# Patient Record
Sex: Female | Born: 1970 | Race: White | Hispanic: No | Marital: Single | Smoking: Former smoker
Health system: Southern US, Community
[De-identification: ages and names within clinical notes are randomized; demographics above are authoritative.]

## PROBLEM LIST (undated history)

## (undated) DIAGNOSIS — E119 Type 2 diabetes mellitus without complications: Secondary | ICD-10-CM

## (undated) DIAGNOSIS — J45909 Unspecified asthma, uncomplicated: Secondary | ICD-10-CM

## (undated) DIAGNOSIS — I10 Essential (primary) hypertension: Secondary | ICD-10-CM

## (undated) DIAGNOSIS — M797 Fibromyalgia: Secondary | ICD-10-CM

## (undated) DIAGNOSIS — K529 Noninfective gastroenteritis and colitis, unspecified: Secondary | ICD-10-CM

## (undated) DIAGNOSIS — G629 Polyneuropathy, unspecified: Secondary | ICD-10-CM

## (undated) DIAGNOSIS — G43909 Migraine, unspecified, not intractable, without status migrainosus: Secondary | ICD-10-CM

## (undated) DIAGNOSIS — G4730 Sleep apnea, unspecified: Secondary | ICD-10-CM

## (undated) DIAGNOSIS — K219 Gastro-esophageal reflux disease without esophagitis: Secondary | ICD-10-CM

## (undated) DIAGNOSIS — M545 Low back pain, unspecified: Secondary | ICD-10-CM

## (undated) DIAGNOSIS — F32A Depression, unspecified: Secondary | ICD-10-CM

## (undated) HISTORY — PX: TUBAL LIGATION: SHX77

## (undated) HISTORY — PX: ABDOMINAL HYSTERECTOMY: SHX81

## (undated) HISTORY — PX: HAND SURGERY: SHX662

## (undated) HISTORY — PX: BACK SURGERY: SHX140

## (undated) HISTORY — PX: ENDOSCOPIC PLANTAR FASCIOTOMY: SUR443

---

## 2011-08-11 HISTORY — PX: BACK SURGERY: SHX140

## 2021-04-15 ENCOUNTER — Encounter (HOSPITAL_COMMUNITY): Payer: Self-pay

## 2021-04-15 ENCOUNTER — Inpatient Hospital Stay (HOSPITAL_COMMUNITY)
Admission: EM | Admit: 2021-04-15 | Discharge: 2021-04-18 | DRG: 872 | Disposition: A | Discharge status: Home / Self Care | Payer: Medicare PPO | Attending: Internal Medicine | Admitting: Internal Medicine

## 2021-04-15 ENCOUNTER — Other Ambulatory Visit: Payer: Self-pay

## 2021-04-15 DIAGNOSIS — D75838 Other thrombocytosis: Secondary | ICD-10-CM | POA: Diagnosis present

## 2021-04-15 DIAGNOSIS — F32A Depression, unspecified: Secondary | ICD-10-CM | POA: Diagnosis present

## 2021-04-15 DIAGNOSIS — K219 Gastro-esophageal reflux disease without esophagitis: Secondary | ICD-10-CM | POA: Diagnosis present

## 2021-04-15 DIAGNOSIS — E785 Hyperlipidemia, unspecified: Secondary | ICD-10-CM | POA: Diagnosis present

## 2021-04-15 DIAGNOSIS — I10 Essential (primary) hypertension: Secondary | ICD-10-CM | POA: Diagnosis present

## 2021-04-15 DIAGNOSIS — A419 Sepsis, unspecified organism: Principal | ICD-10-CM | POA: Diagnosis present

## 2021-04-15 DIAGNOSIS — R197 Diarrhea, unspecified: Secondary | ICD-10-CM | POA: Diagnosis not present

## 2021-04-15 DIAGNOSIS — A09 Infectious gastroenteritis and colitis, unspecified: Secondary | ICD-10-CM | POA: Diagnosis present

## 2021-04-15 DIAGNOSIS — E1142 Type 2 diabetes mellitus with diabetic polyneuropathy: Secondary | ICD-10-CM | POA: Diagnosis present

## 2021-04-15 DIAGNOSIS — Z20822 Contact with and (suspected) exposure to covid-19: Secondary | ICD-10-CM | POA: Diagnosis present

## 2021-04-15 DIAGNOSIS — Z90710 Acquired absence of both cervix and uterus: Secondary | ICD-10-CM

## 2021-04-15 DIAGNOSIS — K529 Noninfective gastroenteritis and colitis, unspecified: Secondary | ICD-10-CM | POA: Diagnosis present

## 2021-04-15 DIAGNOSIS — J45909 Unspecified asthma, uncomplicated: Secondary | ICD-10-CM | POA: Diagnosis present

## 2021-04-15 DIAGNOSIS — E119 Type 2 diabetes mellitus without complications: Secondary | ICD-10-CM

## 2021-04-15 DIAGNOSIS — Z794 Long term (current) use of insulin: Secondary | ICD-10-CM

## 2021-04-15 DIAGNOSIS — K5900 Constipation, unspecified: Secondary | ICD-10-CM | POA: Diagnosis present

## 2021-04-15 DIAGNOSIS — Z87891 Personal history of nicotine dependence: Secondary | ICD-10-CM

## 2021-04-15 DIAGNOSIS — Z803 Family history of malignant neoplasm of breast: Secondary | ICD-10-CM

## 2021-04-15 HISTORY — DX: Noninfective gastroenteritis and colitis, unspecified: K52.9

## 2021-04-15 HISTORY — DX: Unspecified asthma, uncomplicated: J45.909

## 2021-04-15 HISTORY — DX: Gastro-esophageal reflux disease without esophagitis: K21.9

## 2021-04-15 HISTORY — DX: Type 2 diabetes mellitus without complications: E11.9

## 2021-04-15 HISTORY — DX: Migraine, unspecified, not intractable, without status migrainosus: G43.909

## 2021-04-15 HISTORY — DX: Depression, unspecified: F32.A

## 2021-04-15 HISTORY — DX: Polyneuropathy, unspecified: G62.9

## 2021-04-15 LAB — CBC WITH DIFFERENTIAL/PLATELET
Abs Immature Granulocytes: 0.13 10*3/uL — ABNORMAL HIGH (ref 0.00–0.07)
Basophils Absolute: 0.1 10*3/uL (ref 0.0–0.1)
Basophils Relative: 1 %
Eosinophils Absolute: 0.5 10*3/uL (ref 0.0–0.5)
Eosinophils Relative: 2 %
HCT: 42.3 % (ref 36.0–46.0)
Hemoglobin: 13.4 g/dL (ref 12.0–15.0)
Immature Granulocytes: 1 %
Lymphocytes Relative: 16 %
Lymphs Abs: 3.1 10*3/uL (ref 0.7–4.0)
MCH: 26.1 pg (ref 26.0–34.0)
MCHC: 31.7 g/dL (ref 30.0–36.0)
MCV: 82.5 fL (ref 80.0–100.0)
Monocytes Absolute: 1.6 10*3/uL — ABNORMAL HIGH (ref 0.1–1.0)
Monocytes Relative: 9 %
Neutro Abs: 13.7 10*3/uL — ABNORMAL HIGH (ref 1.7–7.7)
Neutrophils Relative %: 71 %
Platelets: 421 10*3/uL — ABNORMAL HIGH (ref 150–400)
RBC: 5.13 MIL/uL — ABNORMAL HIGH (ref 3.87–5.11)
RDW: 14.1 % (ref 11.5–15.5)
WBC: 19 10*3/uL — ABNORMAL HIGH (ref 4.0–10.5)
nRBC: 0 % (ref 0.0–0.2)

## 2021-04-15 LAB — URINALYSIS, ROUTINE W REFLEX MICROSCOPIC
Bilirubin Urine: NEGATIVE
Glucose, UA: 250 mg/dL — AB
Hgb urine dipstick: NEGATIVE
Ketones, ur: NEGATIVE mg/dL
Leukocytes,Ua: NEGATIVE
Nitrite: NEGATIVE
Protein, ur: NEGATIVE mg/dL
Specific Gravity, Urine: 1.01 (ref 1.005–1.030)
pH: 6.5 (ref 5.0–8.0)

## 2021-04-15 LAB — C DIFFICILE QUICK SCREEN W PCR REFLEX
C Diff antigen: NEGATIVE
C Diff interpretation: NOT DETECTED
C Diff toxin: NEGATIVE

## 2021-04-15 MED ORDER — ONDANSETRON HCL 4 MG/2ML IJ SOLN
4.0000 mg | Freq: Once | INTRAMUSCULAR | Status: AC
  Administered 2021-04-16: 4 mg via INTRAVENOUS
  Filled 2021-04-15: qty 2

## 2021-04-15 MED ORDER — MORPHINE SULFATE (PF) 4 MG/ML IV SOLN
4.0000 mg | Freq: Once | INTRAVENOUS | Status: AC
  Administered 2021-04-15: 4 mg via INTRAMUSCULAR
  Filled 2021-04-15: qty 1

## 2021-04-15 MED ORDER — HYDROMORPHONE HCL 2 MG/ML IJ SOLN: 2.0000 mg | Freq: Once | INTRAMUSCULAR | Status: DC

## 2021-04-15 MED ORDER — MORPHINE SULFATE (PF) 4 MG/ML IV SOLN
4.0000 mg | Freq: Once | INTRAVENOUS | Status: AC
Start: 2021-04-15 — End: 2021-04-16
  Administered 2021-04-16: 4 mg via INTRAVENOUS
  Filled 2021-04-15: qty 1

## 2021-04-15 MED ORDER — SODIUM CHLORIDE 0.9 % IV BOLUS
1000.0000 mL | Freq: Once | INTRAVENOUS | Status: AC
  Administered 2021-04-16: 1000 mL via INTRAVENOUS

## 2021-04-15 NOTE — ED Provider Notes (Signed)
[facility]-EMERGENCY DEPT Provider Note   CSN: [id number] Arrival date & time: 04/15/21  1826     History Chief Complaint  Patient presents with   Abdominal Pain   Blood In Stools   Emesis    [name] is a 50 y.o. female.  She is here with complaint of crampy abdominal pain that woke her up 3 this morning.  She went to the bathroom and initially was constipated but then had looser stools and eventually diarrhea.  She had the diarrhea was initially brown and dark but then became more pink in color.  Continues with 10 out of 10 cramping stabbing abdominal pain.  Feeling lightheaded.  Nausea no vomiting.  No fever.  No sick contacts.  Called her PCP who recommended she come to the emergency department for evaluation.  She does have a history of irritable bowel disease and has had hemorrhoidal bleeding in the past.  The history is provided by the patient.  Abdominal Pain Pain location:  Generalized Pain quality: cramping and stabbing   Pain radiates to:  Back Pain severity:  Severe Onset quality:  Sudden Duration:  16 hours Timing:  Constant Progression:  Unchanged Chronicity:  New Context: awakening from sleep   Context: not sick contacts and not trauma   Relieved by:  Nothing Worsened by:  Nothing Ineffective treatments:  Liquids and bowel activity Associated symptoms: constipation, diarrhea, fatigue, hematochezia, nausea and vomiting   Associated symptoms: no chest pain, no chills, no cough, no dysuria, no fever, no hematemesis, no hematuria, no shortness of breath, no sore throat, no vaginal bleeding and no vaginal discharge   Emesis Associated symptoms: abdominal pain and diarrhea   Associated symptoms: no chills, no cough, no fever, no headaches and no sore throat       Past Medical History:  Diagnosis Date   Asthma    Diabetes mellitus without complication (HCC)    GERD (gastroesophageal reflux disease)    IBD (inflammatory bowel  disease)    Migraine    Neuropathy     There are no problems to display for this patient.   Past Surgical History:  Procedure Laterality Date   ABDOMINAL HYSTERECTOMY     BACK SURGERY     ENDOSCOPIC PLANTAR FASCIOTOMY     HAND SURGERY     TUBAL LIGATION       OB History   No obstetric history on file.     History reviewed. No pertinent family history.  Social History   Tobacco Use   Smoking status: Never   Smokeless tobacco: Never  Vaping Use   Vaping Use: Never used  Substance Use Topics   Alcohol use: Yes   Drug use: Never    Home Medications Prior to Admission medications   Not on File    Allergies    Benadryl [diphenhydramine], Compazine [prochlorperazine], Lamictal [lamotrigine], Other, and Reglan [metoclopramide]  Review of Systems   Review of Systems  Constitutional:  Positive for fatigue. Negative for chills and fever.  HENT:  Negative for sore throat.   Eyes:  Negative for visual disturbance.  Respiratory:  Negative for cough and shortness of breath.   Cardiovascular:  Negative for chest pain.  Gastrointestinal:  Positive for abdominal pain, blood in stool, constipation, diarrhea, hematochezia, nausea and vomiting. Negative for hematemesis.  Genitourinary:  Negative for dysuria, hematuria, vaginal bleeding and vaginal discharge.  Musculoskeletal:  Positive for back pain.  Skin:  Negative for rash.  Neurological:  Negative  for headaches.   Physical Exam Updated Vital Signs BP (!) 158/100 (BP Location: Left Arm)   Pulse (!) 109   Temp 97.8 F (36.6 C) (Oral)   Resp 16   Ht 5\' 8"  (1.727 m)   Wt 96.6 kg   SpO2 99%   BMI 32.39 kg/m   Physical Exam Vitals and nursing note reviewed.  Constitutional:      General: She is not in acute distress.    Appearance: Normal appearance. She is well-developed.  HENT:     Head: Normocephalic and atraumatic.  Eyes:     Conjunctiva/sclera: Conjunctivae normal.  Cardiovascular:     Rate and Rhythm:  Normal rate and regular rhythm.     Heart sounds: No murmur heard. Pulmonary:     Effort: Pulmonary effort is normal. No respiratory distress.     Breath sounds: Normal breath sounds.  Abdominal:     Palpations: Abdomen is soft.     Tenderness: There is generalized abdominal tenderness. There is no guarding or rebound.  Musculoskeletal:        General: No deformity or signs of injury. Normal range of motion.     Cervical back: Neck supple.  Skin:    General: Skin is warm and dry.  Neurological:     General: No focal deficit present.     Mental Status: She is alert.    ED Results / Procedures / Treatments   Labs (all labs ordered are listed, but only abnormal results are displayed) Labs Reviewed  URINALYSIS, ROUTINE W REFLEX MICROSCOPIC - Abnormal; Notable for the following components:      Result Value   Glucose, UA 250 (*)    All other components within normal limits  COMPREHENSIVE METABOLIC PANEL - Abnormal; Notable for the following components:   Sodium 133 (*)    Potassium 5.2 (*)    CO2 18 (*)    Glucose, Bld 235 (*)    All other components within normal limits  CBC WITH DIFFERENTIAL/PLATELET - Abnormal; Notable for the following components:   WBC 19.0 (*)    RBC 5.13 (*)    Platelets 421 (*)    Neutro Abs 13.7 (*)    Monocytes Absolute 1.6 (*)    Abs Immature Granulocytes 0.13 (*)    All other components within normal limits  BASIC METABOLIC PANEL - Abnormal; Notable for the following components:   Chloride 112 (*)    CO2 19 (*)    Glucose, Bld 206 (*)    Calcium 8.6 (*)    All other components within normal limits  HEMOGLOBIN A1C - Abnormal; Notable for the following components:   Hgb A1c MFr Bld 8.6 (*)    All other components within normal limits  GLUCOSE, CAPILLARY - Abnormal; Notable for the following components:   Glucose-Capillary 232 (*)    All other components within normal limits  CBC - Abnormal; Notable for the following components:   WBC 14.2 (*)     Hemoglobin 11.8 (*)    All other components within normal limits  GLUCOSE, CAPILLARY - Abnormal; Notable for the following components:   Glucose-Capillary 163 (*)    All other components within normal limits  C DIFFICILE QUICK SCREEN W PCR REFLEX    RESP PANEL BY RT-PCR (FLU A&B, COVID) [id number]  GASTROINTESTINAL PANEL BY PCR, STOOL (REPLACES STOOL CULTURE)  LIPASE, BLOOD  LACTIC ACID, PLASMA  HIV ANTIBODY (ROUTINE TESTING W REFLEX)  TYPE AND SCREEN  ABO/RH    EKG  None  Radiology CT Abdomen Pelvis W Contrast  Result Date: 04/16/2021 CLINICAL DATA:  Generalized abdominal pain. Nausea and vomiting. Bloody diarrhea. Elevated white blood cell count. EXAM: CT ABDOMEN AND PELVIS WITH CONTRAST TECHNIQUE: Multidetector CT imaging of the abdomen and pelvis was performed using the standard protocol following bolus administration of intravenous contrast. CONTRAST:  59mL OMNIPAQUE IOHEXOL 350 MG/ML SOLN COMPARISON:  None. FINDINGS: Lower chest: No acute airspace disease or pleural effusion. Heart is normal in size. Hepatobiliary: Borderline hepatic steatosis. No focal hepatic lesion. Questionable layering sludge in the gallbladder. No calcified gallstone. No pericholecystic inflammation. No biliary dilatation. Pancreas: No ductal dilatation or inflammation. Spleen: Upper normal in size spanning 14.2 cm cranial caudal. No focal abnormality. Adrenals/Urinary Tract: Low-density left adrenal nodule measuring 11 mm transverse dimension most consistent with adenoma. Normal right adrenal gland. No hydronephrosis or perinephric edema. Homogeneous renal enhancement with symmetric excretion on delayed phase imaging. No visualized renal calculi. Subcentimeter low-density in the posterior lower left kidney is likely cyst but too small to characterize. Urinary bladder is physiologically distended without wall thickening. Stomach/Bowel: Colonic wall thickening with pericolonic edema extending from the splenic flexure of  the colon through the mid sigmoid. There is liquid stool in the left colon. No pneumatosis. Moderate colonic stool burden in the ascending and transverse colon. There is no obvious mass lesion at transition from stool filled to decompressed transverse colon. Normal appendix. Few fluid-filled loops of small bowel in the left abdomen likely reactive. No small bowel obstruction. Unremarkable stomach. Vascular/Lymphatic: Normal caliber abdominal aorta. Patent portal vein. No portal venous or mesenteric gas. Patent portal vein. Few small central mesenteric lymph nodes are not enlarged by size criteria and typically reactive. No enlarged lymph nodes in the abdomen or pelvis. Reproductive: Hysterectomy.  No adnexal mass. Other: No perforation or abscess. No ascites or free fluid. Trace thickening of the left pericolic gutter is reactive. Minimal fat in the left inguinal canal. Musculoskeletal: L5-S1 degenerative disc disease. There are no acute or suspicious osseous abnormalities. IMPRESSION: 1. Colitis extending from the splenic flexure of the colon through the mid sigmoid colon, likely infectious or inflammatory. No perforation or abscess. 2. Moderate stool in the right colon. While there is no obvious colonic mass at the transition from stool filled decompressed colon, up-to-date colonoscopy is recommended 3. Borderline hepatic steatosis. 4. Left adrenal adenoma. Electronically Signed   By: Narda Rutherford M.D.   On: 04/16/2021 01:37    Procedures Procedures   Medications Ordered in ED Medications  sodium chloride 0.9 % bolus 1,000 mL (has no administration in time range)  ondansetron (ZOFRAN) injection 4 mg (has no administration in time range)  morphine 4 MG/ML injection 4 mg (has no administration in time range)    ED Course  I have reviewed the triage vital signs and the nursing notes.  Pertinent labs & imaging results that were available during my care of the patient were reviewed by me and  considered in my medical decision making (see chart for details).  Clinical Course as of 04/16/21 1002  Tue Apr 15, 2021  2358 Ketones, ur: NEGATIVE [PC]  Wed Apr 16, 2021  0000 Patient's care signed out to Dr. Eudelia Bunch for further evaluation of labs and imaging.  Likely will need admission to the hospital. [MB]    Clinical Course User Index [MB] Terrilee Files, MD [PC] Cardama, Amadeo Garnet, MD   MDM Rules/Calculators/A&P  This patient complains of crampy abdominal pain, blood in her stool, diarrhea; this involves an extensive number of treatment Options and is a complaint that carries with it a high risk of complications and Morbidity. The differential includes colitis, diverticulitis, diverticulosis, C. difficile, infectious enteritis, dehydration, metabolic derangement  I ordered, reviewed and interpreted labs, which included CBC with elevated white count, normal hemoglobin, chemistries with low bicarb elevated glucose I ordered medication IV pain medication fluids nausea medication I ordered imaging studies which included CT abdomen pelvis, this is pending at time of signout Previous records obtained and reviewed in epic no recent admissions  After the interventions stated above, I reevaluated the patient and found patient to be symptomatically tender in her abdomen.  Signout to Dr. Eudelia Bunch ED physician with need for IV access and imaging.  Anticipate will need admission to the hospital for further management of probable colitis.   Final Clinical Impression(s) / ED Diagnoses Final diagnoses:  Colitis    Rx / DC Orders ED Discharge Orders     None        Terrilee Files, MD 04/16/21 1006

## 2021-04-15 NOTE — ED Provider Notes (Signed)
Emergency Medicine Provider Triage Evaluation Note  Veronica Cortez , a 50 y.o. female  was evaluated in triage.  Pt complains of abdominal pain.  Patient states that she was feeling normal last night and after eating steak, mushrooms, watermelon, and salad she woke up around 3 AM this morning with diffuse cramping abdominal pain, nausea without vomiting, as well as bloody diarrhea.  States that her stools initially appeared darker than normal, then turned watery brown, and then began turning red/pink.  Reports persistent diarrhea throughout the day today and states that she now has fatigue and lightheadedness when standing.  She reached out to her PCP who recommended that she come to the emergency department for evaluation.  Physical Exam  BP (!) 158/100 (BP Location: Left Arm)   Pulse (!) 109   Temp 97.8 F (36.6 C) (Oral)   Resp 16   Ht 5\' 8"  (1.727 m)   Wt 96.6 kg   SpO2 99%   BMI 32.39 kg/m  Gen:   Awake, no distress   Resp:  Normal effort  MSK:   Moves extremities without difficulty  Other:    Medical Decision Making  Medically screening exam initiated at 6:51 PM.  Appropriate orders placed.  Veronica Cortez was informed that the remainder of the evaluation will be completed by another provider, this initial triage assessment does not replace that evaluation, and the importance of remaining in the ED until their evaluation is complete.   Johnsie Kindred, PA-C 04/15/21 1852    06/15/21, MD 04/16/21 1006

## 2021-04-15 NOTE — ED Triage Notes (Signed)
Patient c/o generalized abdominal pain, bloody diarrhea, and vomiting since 0300. Patient states she called her doctor and told her to come to the ED to r/o E. Coli. Patient had steak and mushrooms, watermelon and salad.

## 2021-04-16 ENCOUNTER — Emergency Department (HOSPITAL_COMMUNITY): Payer: Medicare PPO

## 2021-04-16 ENCOUNTER — Encounter (HOSPITAL_COMMUNITY): Payer: Self-pay

## 2021-04-16 DIAGNOSIS — E119 Type 2 diabetes mellitus without complications: Secondary | ICD-10-CM

## 2021-04-16 DIAGNOSIS — F32A Depression, unspecified: Secondary | ICD-10-CM | POA: Diagnosis present

## 2021-04-16 DIAGNOSIS — R197 Diarrhea, unspecified: Secondary | ICD-10-CM | POA: Diagnosis present

## 2021-04-16 DIAGNOSIS — A419 Sepsis, unspecified organism: Secondary | ICD-10-CM | POA: Diagnosis present

## 2021-04-16 DIAGNOSIS — I10 Essential (primary) hypertension: Secondary | ICD-10-CM | POA: Diagnosis present

## 2021-04-16 DIAGNOSIS — Z90710 Acquired absence of both cervix and uterus: Secondary | ICD-10-CM | POA: Diagnosis not present

## 2021-04-16 DIAGNOSIS — K529 Noninfective gastroenteritis and colitis, unspecified: Secondary | ICD-10-CM | POA: Diagnosis not present

## 2021-04-16 DIAGNOSIS — E1142 Type 2 diabetes mellitus with diabetic polyneuropathy: Secondary | ICD-10-CM | POA: Diagnosis present

## 2021-04-16 DIAGNOSIS — Z794 Long term (current) use of insulin: Secondary | ICD-10-CM | POA: Diagnosis not present

## 2021-04-16 DIAGNOSIS — D75838 Other thrombocytosis: Secondary | ICD-10-CM | POA: Diagnosis present

## 2021-04-16 DIAGNOSIS — K5900 Constipation, unspecified: Secondary | ICD-10-CM | POA: Diagnosis present

## 2021-04-16 DIAGNOSIS — A09 Infectious gastroenteritis and colitis, unspecified: Secondary | ICD-10-CM | POA: Diagnosis present

## 2021-04-16 DIAGNOSIS — Z87891 Personal history of nicotine dependence: Secondary | ICD-10-CM | POA: Diagnosis not present

## 2021-04-16 DIAGNOSIS — J45909 Unspecified asthma, uncomplicated: Secondary | ICD-10-CM

## 2021-04-16 DIAGNOSIS — E785 Hyperlipidemia, unspecified: Secondary | ICD-10-CM | POA: Diagnosis present

## 2021-04-16 DIAGNOSIS — Z803 Family history of malignant neoplasm of breast: Secondary | ICD-10-CM | POA: Diagnosis not present

## 2021-04-16 DIAGNOSIS — Z20822 Contact with and (suspected) exposure to covid-19: Secondary | ICD-10-CM | POA: Diagnosis present

## 2021-04-16 DIAGNOSIS — K219 Gastro-esophageal reflux disease without esophagitis: Secondary | ICD-10-CM | POA: Diagnosis present

## 2021-04-16 LAB — HIV ANTIBODY (ROUTINE TESTING W REFLEX): HIV Screen 4th Generation wRfx: NONREACTIVE

## 2021-04-16 LAB — GASTROINTESTINAL PANEL BY PCR, STOOL (REPLACES STOOL CULTURE)

## 2021-04-16 LAB — CBC
HCT: 36.6 % (ref 36.0–46.0)
Hemoglobin: 11.8 g/dL — ABNORMAL LOW (ref 12.0–15.0)
MCH: 26.8 pg (ref 26.0–34.0)
MCHC: 32.2 g/dL (ref 30.0–36.0)
MCV: 83.2 fL (ref 80.0–100.0)
Platelets: 310 K/uL (ref 150–400)
RBC: 4.4 MIL/uL (ref 3.87–5.11)
RDW: 14 % (ref 11.5–15.5)
WBC: 14.2 K/uL — ABNORMAL HIGH (ref 4.0–10.5)
nRBC: 0 % (ref 0.0–0.2)

## 2021-04-16 LAB — BASIC METABOLIC PANEL WITH GFR
Anion gap: 7 (ref 5–15)
BUN: 12 mg/dL (ref 6–20)
CO2: 19 mmol/L — ABNORMAL LOW (ref 22–32)
Calcium: 8.6 mg/dL — ABNORMAL LOW (ref 8.9–10.3)
Chloride: 112 mmol/L — ABNORMAL HIGH (ref 98–111)
Creatinine, Ser: 0.68 mg/dL (ref 0.44–1.00)
GFR, Estimated: >60 mL/min
Glucose, Bld: 206 mg/dL — ABNORMAL HIGH (ref 70–99)
Potassium: 3.8 mmol/L (ref 3.5–5.1)
Sodium: 138 mmol/L (ref 135–145)

## 2021-04-16 LAB — TYPE AND SCREEN
ABO/RH(D): O POS
Antibody Screen: NEGATIVE

## 2021-04-16 LAB — ABO/RH: ABO/RH(D): O POS

## 2021-04-16 LAB — COMPREHENSIVE METABOLIC PANEL
ALT: 8 U/L (ref 0–44)
AST: 40 U/L (ref 15–41)
Albumin: 3.9 g/dL (ref 3.5–5.0)
Alkaline Phosphatase: 90 U/L (ref 38–126)
Anion gap: 11 (ref 5–15)
BUN: 14 mg/dL (ref 6–20)
CO2: 18 mmol/L — ABNORMAL LOW (ref 22–32)
Calcium: 8.9 mg/dL (ref 8.9–10.3)
Chloride: 104 mmol/L (ref 98–111)
Creatinine, Ser: 0.81 mg/dL (ref 0.44–1.00)
GFR, Estimated: >60 mL/min (ref >60)
Glucose, Bld: 235 mg/dL — ABNORMAL HIGH (ref 70–99)
Potassium: 5.2 mmol/L — ABNORMAL HIGH (ref 3.5–5.1)
Sodium: 133 mmol/L — ABNORMAL LOW (ref 135–145)
Total Bilirubin: 1.2 mg/dL (ref 0.3–1.2)
Total Protein: 7.3 g/dL (ref 6.5–8.1)

## 2021-04-16 LAB — RESP PANEL BY RT-PCR (FLU A&B, COVID) ARPGX2
Influenza A by PCR: NEGATIVE
Influenza B by PCR: NEGATIVE
SARS Coronavirus 2 by RT PCR: NEGATIVE

## 2021-04-16 LAB — GLUCOSE, CAPILLARY
Glucose-Capillary: 232 mg/dL — ABNORMAL HIGH (ref 70–99)
Glucose-Capillary: 163 mg/dL — ABNORMAL HIGH (ref 70–99)
Glucose-Capillary: 261 mg/dL — ABNORMAL HIGH (ref 70–99)
Glucose-Capillary: 267 mg/dL — ABNORMAL HIGH (ref 70–99)
Glucose-Capillary: 195 mg/dL — ABNORMAL HIGH (ref 70–99)
Glucose-Capillary: 193 mg/dL — ABNORMAL HIGH (ref 70–99)

## 2021-04-16 LAB — HEMOGLOBIN A1C
Hgb A1c MFr Bld: 8.6 % — ABNORMAL HIGH (ref 4.8–5.6)
Mean Plasma Glucose: 200.12 mg/dL

## 2021-04-16 LAB — LACTIC ACID, PLASMA: Lactic Acid, Venous: 1.5 mmol/L (ref 0.5–1.9)

## 2021-04-16 LAB — LIPASE, BLOOD: Lipase: 35 U/L (ref 11–51)

## 2021-04-16 MED ORDER — SODIUM CHLORIDE 0.9 % IV SOLN: INTRAVENOUS | Status: AC

## 2021-04-16 MED ORDER — UBROGEPANT 100 MG PO TABS: 100.0000 mg | ORAL_TABLET | Freq: Two times a day (BID) | ORAL | Status: DC | PRN

## 2021-04-16 MED ORDER — SODIUM CHLORIDE 0.9 % IV BOLUS
1000.0000 mL | Freq: Once | INTRAVENOUS | Status: AC
  Administered 2021-04-16: 1000 mL via INTRAVENOUS

## 2021-04-16 MED ORDER — TOPIRAMATE 100 MG PO TABS
200.0000 mg | ORAL_TABLET | Freq: Once | ORAL | Status: AC
  Administered 2021-04-17: 200 mg via ORAL
  Filled 2021-04-16: qty 2

## 2021-04-16 MED ORDER — ONDANSETRON HCL 4 MG PO TABS: 4.0000 mg | ORAL_TABLET | Freq: Four times a day (QID) | ORAL | Status: DC | PRN

## 2021-04-16 MED ORDER — METRONIDAZOLE 500 MG/100ML IV SOLN
500.0000 mg | Freq: Two times a day (BID) | INTRAVENOUS | Status: DC
  Administered 2021-04-16 – 2021-04-18 (×5): 500 mg via INTRAVENOUS
  Filled 2021-04-16 (×5): qty 100

## 2021-04-16 MED ORDER — ALBUTEROL SULFATE (2.5 MG/3ML) 0.083% IN NEBU: 3.0000 mL | INHALATION_SOLUTION | RESPIRATORY_TRACT | Status: DC | PRN

## 2021-04-16 MED ORDER — PANTOPRAZOLE SODIUM 40 MG PO TBEC
40.0000 mg | DELAYED_RELEASE_TABLET | Freq: Every day | ORAL | Status: DC
  Administered 2021-04-16 – 2021-04-18 (×3): 40 mg via ORAL
  Filled 2021-04-16 (×3): qty 1

## 2021-04-16 MED ORDER — HYDROMORPHONE HCL 1 MG/ML IJ SOLN
0.5000 mg | Freq: Once | INTRAMUSCULAR | Status: AC
  Administered 2021-04-16: 0.5 mg via INTRAVENOUS
  Filled 2021-04-16: qty 1

## 2021-04-16 MED ORDER — INSULIN ASPART 100 UNIT/ML IJ SOLN
0.0000 [IU] | INTRAMUSCULAR | Status: DC
  Administered 2021-04-16: 2 [IU] via SUBCUTANEOUS
  Administered 2021-04-16: 5 [IU] via SUBCUTANEOUS
  Administered 2021-04-16: 2 [IU] via SUBCUTANEOUS
  Administered 2021-04-16: 3 [IU] via SUBCUTANEOUS
  Administered 2021-04-16: 5 [IU] via SUBCUTANEOUS
  Administered 2021-04-17: 3 [IU] via SUBCUTANEOUS
  Administered 2021-04-17 (×2): 2 [IU] via SUBCUTANEOUS
  Administered 2021-04-17: 1 [IU] via SUBCUTANEOUS
  Administered 2021-04-17: 2 [IU] via SUBCUTANEOUS
  Administered 2021-04-17: 3 [IU] via SUBCUTANEOUS
  Administered 2021-04-18 (×2): 1 [IU] via SUBCUTANEOUS
  Administered 2021-04-18: 3 [IU] via SUBCUTANEOUS
  Filled 2021-04-16: qty 0.09

## 2021-04-16 MED ORDER — HYDROCODONE-ACETAMINOPHEN 5-325 MG PO TABS
1.0000 | ORAL_TABLET | ORAL | Status: DC | PRN
  Administered 2021-04-17 – 2021-04-18 (×4): 2 via ORAL
  Filled 2021-04-16 (×4): qty 2

## 2021-04-16 MED ORDER — PIPERACILLIN-TAZOBACTAM 3.375 G IVPB 30 MIN
3.3750 g | Freq: Once | INTRAVENOUS | Status: AC
  Administered 2021-04-16: 3.375 g via INTRAVENOUS
  Filled 2021-04-16: qty 50

## 2021-04-16 MED ORDER — LABETALOL HCL 5 MG/ML IV SOLN: 10.0000 mg | INTRAVENOUS | Status: DC | PRN

## 2021-04-16 MED ORDER — MOMETASONE FURO-FORMOTEROL FUM 100-5 MCG/ACT IN AERO
2.0000 | INHALATION_SPRAY | Freq: Two times a day (BID) | RESPIRATORY_TRACT | Status: DC
Start: 2021-04-16 — End: 2021-04-18
  Administered 2021-04-16 – 2021-04-18 (×5): 2 via RESPIRATORY_TRACT
  Filled 2021-04-16: qty 8.8

## 2021-04-16 MED ORDER — CIPROFLOXACIN IN D5W 400 MG/200ML IV SOLN
400.0000 mg | Freq: Two times a day (BID) | INTRAVENOUS | Status: DC
  Administered 2021-04-16 – 2021-04-18 (×5): 400 mg via INTRAVENOUS
  Filled 2021-04-16 (×5): qty 200

## 2021-04-16 MED ORDER — HYDROMORPHONE HCL 1 MG/ML IJ SOLN
0.5000 mg | INTRAMUSCULAR | Status: DC | PRN
  Administered 2021-04-16: 0.5 mg via INTRAVENOUS
  Administered 2021-04-16 (×2): 1 mg via INTRAVENOUS
  Administered 2021-04-16: 0.5 mg via INTRAVENOUS
  Administered 2021-04-17 (×3): 1 mg via INTRAVENOUS
  Filled 2021-04-16 (×7): qty 1

## 2021-04-16 MED ORDER — INSULIN GLARGINE-YFGN 100 UNIT/ML ~~LOC~~ SOLN
15.0000 [IU] | Freq: Every day | SUBCUTANEOUS | Status: DC
  Administered 2021-04-16 – 2021-04-17 (×2): 15 [IU] via SUBCUTANEOUS
  Filled 2021-04-16 (×2): qty 0.15

## 2021-04-16 MED ORDER — ONDANSETRON HCL 4 MG/2ML IJ SOLN
4.0000 mg | Freq: Four times a day (QID) | INTRAMUSCULAR | Status: DC | PRN
  Administered 2021-04-16 – 2021-04-17 (×2): 4 mg via INTRAVENOUS
  Filled 2021-04-16 (×3): qty 2

## 2021-04-16 MED ORDER — FLUOXETINE HCL 20 MG PO CAPS
20.0000 mg | ORAL_CAPSULE | Freq: Every day | ORAL | Status: DC
  Administered 2021-04-16 – 2021-04-18 (×3): 20 mg via ORAL
  Filled 2021-04-16 (×3): qty 1

## 2021-04-16 MED ORDER — IOHEXOL 350 MG/ML SOLN
80.0000 mL | Freq: Once | INTRAVENOUS | Status: AC | PRN
  Administered 2021-04-16: 80 mL via INTRAVENOUS

## 2021-04-16 MED ORDER — INSULIN ASPART 100 UNIT/ML IJ SOLN
0.0000 [IU] | INTRAMUSCULAR | Status: DC
  Filled 2021-04-16: qty 0.06

## 2021-04-16 MED ORDER — ACETAMINOPHEN 325 MG PO TABS: 650.0000 mg | ORAL_TABLET | Freq: Four times a day (QID) | ORAL | Status: DC | PRN

## 2021-04-16 MED ORDER — ACETAMINOPHEN 650 MG RE SUPP: 650.0000 mg | Freq: Four times a day (QID) | RECTAL | Status: DC | PRN

## 2021-04-16 NOTE — Plan of Care (Signed)
Patient alert and oriented x4. Oriented to unit. No c/o pain at this time. Will continue to monitor

## 2021-04-16 NOTE — Progress Notes (Signed)
Pass due labs due in ED currently being drawn with the morning labs due at his time.

## 2021-04-16 NOTE — H&P (Signed)
History and Physical    Steele Ledonne IRW:[id number] DOB: 1971/03/11 DOA: 04/15/2021  PCP: Ellyn Hack, MD   Patient coming from: Home   Chief Complaint: Abdominal pain, bloody diarrhea   HPI: Veronica Cortez is a 50 y.o. female with medical history significant for insulin-dependent diabetes mellitus, hypertension, GERD, IBS, hyperlipidemia, migraines, and depression, presenting to emergency department for evaluation of cramping abdominal pain, nausea, and bloody diarrhea.  Patient reports that she was in her usual state of health until she woke at approximately 3 AM on 04/15/2021 with cramping abdominal pain.  She has also had nausea without vomiting and diarrhea.  She continued to have more episodes of diarrhea than she can count with transition from watery stool to bloody diarrhea.  She denies any sick contacts or recent travel, had eaten some watermelon, steak, and mushrooms the night before, but is not aware of anyone else becoming ill after this meal.  She was having ongoing pain which is generalized and cramping in character, persistent bloody diarrhea, and was becoming lightheaded, prompting her presentation to the ED.  ED Course: Upon arrival to the ED, patient is found to be afebrile, saturating well on room air, and tachycardic to 120 with stable blood pressure.  Orthostatic vitals were positive.  Chemistry panel with glucose 235, bicarbonate 18, and potassium 5.2.  CBC with leukocytosis to 19,000 and mild thrombocytosis.  CT with colitis involving splenic flexure through mid sigmoid colon without perforation or abscess.  Patient was given IV fluids, morphine, Zofran, and Zosyn in the ED. C diff was negative.   Review of Systems:  All other systems reviewed and apart from HPI, are negative.  Past Medical History:  Diagnosis Date   Asthma    Depression    Diabetes mellitus without complication (HCC)    GERD (gastroesophageal reflux disease)    IBD (inflammatory bowel  disease)    Migraine    Neuropathy     Past Surgical History:  Procedure Laterality Date   ABDOMINAL HYSTERECTOMY     BACK SURGERY     ENDOSCOPIC PLANTAR FASCIOTOMY     HAND SURGERY     TUBAL LIGATION      Social History:   reports that she quit smoking about 10 years ago. Her smoking use included cigarettes. She has never used smokeless tobacco. She reports current alcohol use of about 2.0 standard drinks per week. She reports that she does not use drugs.  Allergies  Allergen Reactions   Benadryl [Diphenhydramine]    Compazine [Prochlorperazine]    Lamictal [Lamotrigine]    Other     Triptans and statins   Reglan [Metoclopramide]     Family History  Problem Relation Age of Onset   Breast cancer Mother    Kidney failure Father      Prior to Admission medications   Not on File    Physical Exam: Vitals:   04/15/21 2231 04/16/21 0015 04/16/21 0030 04/16/21 0100  BP: 137/85 129/88 137/69 (!) 158/90  Pulse: (!) 116 92 (!) 106 (!) 114  Resp: 18 18 18    Temp:      TempSrc:      SpO2: 96% 96% 93% 94%  Weight:      Height:        Constitutional: NAD, calm  Eyes: PERTLA, lids and conjunctivae normal ENMT: Mucous membranes are moist. Posterior pharynx clear of any exudate or lesions.   Neck: supple, no masses  Respiratory: no wheezing, no crackles. No  accessory muscle use.  Cardiovascular: S1 & S2 heard, regular rate and rhythm. No extremity edema.   Abdomen: No distension, no tenderness, soft. Bowel sounds active.  Musculoskeletal: no clubbing / cyanosis. No joint deformity upper and lower extremities.   Skin: no significant rashes, lesions, ulcers. Warm, dry, well-perfused. Neurologic: CN 2-12 grossly intact. Sensation intact. Moving all extremities.  Psychiatric: Alert and oriented to person, place, and situation. Pleasant and cooperative.    Labs and Imaging on Admission: I have personally reviewed following labs and imaging studies  CBC: Recent Labs  Lab  04/15/21 2330  WBC 19.0*  NEUTROABS 13.7*  HGB 13.4  HCT 42.3  MCV 82.5  PLT 421*   Basic Metabolic Panel: Recent Labs  Lab 04/15/21 2300  NA 133*  K 5.2*  CL 104  CO2 18*  GLUCOSE 235*  BUN 14  CREATININE 0.81  CALCIUM 8.9   GFR: Estimated Creatinine Clearance: 101 mL/min (by C-G formula based on SCr of 0.81 mg/dL). Liver Function Tests: Recent Labs  Lab 04/15/21 2300  AST 40  ALT 8  ALKPHOS 90  BILITOT 1.2  PROT 7.3  ALBUMIN 3.9   Recent Labs  Lab 04/15/21 2300  LIPASE 35   No results for input(s): AMMONIA in the last 168 hours. Coagulation Profile: No results for input(s): INR, PROTIME in the last 168 hours. Cardiac Enzymes: No results for input(s): CKTOTAL, CKMB, CKMBINDEX, TROPONINI in the last 168 hours. BNP (last 3 results) No results for input(s): PROBNP in the last 8760 hours. HbA1C: No results for input(s): HGBA1C in the last 72 hours. CBG: No results for input(s): GLUCAP in the last 168 hours. Lipid Profile: No results for input(s): CHOL, HDL, LDLCALC, TRIG, CHOLHDL, LDLDIRECT in the last 72 hours. Thyroid Function Tests: No results for input(s): TSH, T4TOTAL, FREET4, T3FREE, THYROIDAB in the last 72 hours. Anemia Panel: No results for input(s): VITAMINB12, FOLATE, FERRITIN, TIBC, IRON, RETICCTPCT in the last 72 hours. Urine analysis:    Component Value Date/Time   COLORURINE YELLOW 04/15/2021 2140   APPEARANCEUR CLEAR 04/15/2021 2140   LABSPEC 1.010 04/15/2021 2140   PHURINE 6.5 04/15/2021 2140   GLUCOSEU 250 (A) 04/15/2021 2140   HGBUR NEGATIVE 04/15/2021 2140   BILIRUBINUR NEGATIVE 04/15/2021 2140   KETONESUR NEGATIVE 04/15/2021 2140   PROTEINUR NEGATIVE 04/15/2021 2140   NITRITE NEGATIVE 04/15/2021 2140   LEUKOCYTESUR NEGATIVE 04/15/2021 2140   Sepsis Labs: @LABRCNTIP (procalcitonin:4,lacticidven:4) ) Recent Results (from the past 240 hour(s))  C Difficile Quick Screen w PCR reflex     Status: None   Collection Time: 04/15/21   7:17 PM   Specimen: STOOL  Result Value Ref Range Status   C Diff antigen NEGATIVE NEGATIVE Final   C Diff toxin NEGATIVE NEGATIVE Final   C Diff interpretation No C. difficile detected.  Final    Comment: Performed at [facility], [address], [place], [place]  Resp Panel by RT-PCR (Flu A&B, Covid) Nasopharyngeal Swab     Status: None   Collection Time: 04/16/21 12:19 AM   Specimen: Nasopharyngeal Swab; Nasopharyngeal(NP) swabs in vial transport medium  Result Value Ref Range Status   SARS Coronavirus 2 by RT PCR NEGATIVE NEGATIVE Final    Comment: (NOTE) SARS-CoV-2 target nucleic acids are NOT DETECTED.  The SARS-CoV-2 RNA is generally detectable in upper respiratory specimens during the acute phase of infection. The lowest concentration of SARS-CoV-2 viral copies this assay can detect is 138 copies/mL. A negative result does not preclude  SARS-Cov-2 infection and should not be used as the sole basis for treatment or other patient management decisions. A negative result may occur with  improper specimen collection/handling, submission of specimen other than nasopharyngeal swab, presence of viral mutation(s) within the areas targeted by this assay, and inadequate number of viral copies(<138 copies/mL). A negative result must be combined with clinical observations, patient history, and epidemiological information. The expected result is Negative.  Fact Sheet for Patients:  BloggerCourse.com  Fact Sheet for Healthcare Providers:  SeriousBroker.it  This test is no t yet approved or cleared by the [place] FDA and  has been authorized for detection and/or diagnosis of SARS-CoV-2 by FDA under an Emergency Use Authorization (EUA). This EUA will remain  in effect (meaning this test can be used) for the duration of the COVID-19 declaration under Section 564(b)(1) of the Act, 21 U.S.C.section  360bbb-3(b)(1), unless the authorization is terminated  or revoked sooner.       Influenza A by PCR NEGATIVE NEGATIVE Final   Influenza B by PCR NEGATIVE NEGATIVE Final    Comment: (NOTE) The Xpert Xpress SARS-CoV-2/FLU/RSV plus assay is intended as an aid in the diagnosis of influenza from Nasopharyngeal swab specimens and should not be used as a sole basis for treatment. Nasal washings and aspirates are unacceptable for Xpert Xpress SARS-CoV-2/FLU/RSV testing.  Fact Sheet for Patients: BloggerCourse.com  Fact Sheet for Healthcare Providers: SeriousBroker.it  This test is not yet approved or cleared by the [place] FDA and has been authorized for detection and/or diagnosis of SARS-CoV-2 by FDA under an Emergency Use Authorization (EUA). This EUA will remain in effect (meaning this test can be used) for the duration of the COVID-19 declaration under Section 564(b)(1) of the Act, 21 U.S.C. section 360bbb-3(b)(1), unless the authorization is terminated or revoked.  Performed at [facility], [address] [place], [place] [address]      Radiological Exams on Admission: CT Abdomen Pelvis W Contrast  Result Date: 04/16/2021 CLINICAL DATA:  Generalized abdominal pain. Nausea and vomiting. Bloody diarrhea. Elevated white blood cell count. EXAM: CT ABDOMEN AND PELVIS WITH CONTRAST TECHNIQUE: Multidetector CT imaging of the abdomen and pelvis was performed using the standard protocol following bolus administration of intravenous contrast. CONTRAST:  28mL OMNIPAQUE IOHEXOL 350 MG/ML SOLN COMPARISON:  None. FINDINGS: Lower chest: No acute airspace disease or pleural effusion. Heart is normal in size. Hepatobiliary: Borderline hepatic steatosis. No focal hepatic lesion. Questionable layering sludge in the gallbladder. No calcified gallstone. No pericholecystic inflammation. No biliary dilatation. Pancreas: No ductal  dilatation or inflammation. Spleen: Upper normal in size spanning 14.2 cm cranial caudal. No focal abnormality. Adrenals/Urinary Tract: Low-density left adrenal nodule measuring 11 mm transverse dimension most consistent with adenoma. Normal right adrenal gland. No hydronephrosis or perinephric edema. Homogeneous renal enhancement with symmetric excretion on delayed phase imaging. No visualized renal calculi. Subcentimeter low-density in the posterior lower left kidney is likely cyst but too small to characterize. Urinary bladder is physiologically distended without wall thickening. Stomach/Bowel: Colonic wall thickening with pericolonic edema extending from the splenic flexure of the colon through the mid sigmoid. There is liquid stool in the left colon. No pneumatosis. Moderate colonic stool burden in the ascending and transverse colon. There is no obvious mass lesion at transition from stool filled to decompressed transverse colon. Normal appendix. Few fluid-filled loops of small bowel in the left abdomen likely reactive. No small bowel obstruction. Unremarkable stomach. Vascular/Lymphatic: Normal caliber abdominal aorta. Patent portal vein. No  portal venous or mesenteric gas. Patent portal vein. Few small central mesenteric lymph nodes are not enlarged by size criteria and typically reactive. No enlarged lymph nodes in the abdomen or pelvis. Reproductive: Hysterectomy.  No adnexal mass. Other: No perforation or abscess. No ascites or free fluid. Trace thickening of the left pericolic gutter is reactive. Minimal fat in the left inguinal canal. Musculoskeletal: L5-S1 degenerative disc disease. There are no acute or suspicious osseous abnormalities. IMPRESSION: 1. Colitis extending from the splenic flexure of the colon through the mid sigmoid colon, likely infectious or inflammatory. No perforation or abscess. 2. Moderate stool in the right colon. While there is no obvious colonic mass at the transition from stool  filled decompressed colon, up-to-date colonoscopy is recommended 3. Borderline hepatic steatosis. 4. Left adrenal adenoma. Electronically Signed   By: Narda RutherfordMelanie  Sanford M.D.   On: 04/16/2021 01:37     Assessment/Plan   1. Acute colitis  - Presents with one day of cramping abdominal pain and bloody diarrhea and is found to be tachycardic with WBC 19,000 and colitis on CT  - Continue IVF hydration, empiric antibiotics, enteric precautions, follow-up pending GI pathogen panel, monitor electrolytes, trend H&H, and advance diet as she improves    2. Insulin-dependent DM  - Continue CBG checks and insulin   3. Asthma   - No cough, wheezing, or SOB on admission  - Continue Singulair, ICS/LABA, and as-needed SABA    4. Hypertension  - Pt unsure of atenolol dose, has orthostasis on admission, will treat as-needed only for now     DVT prophylaxis: SCDs  Code Status: Full  Level of Care: Level of care: Med-Surg Family Communication: None present  Disposition Plan:  Patient is from: home  Anticipated d/c is to: Home  Anticipated d/c date is: 04/18/21 Patient currently: Pending tolerance of adequate oral intake, stable blood counts  Consults called: none  Admission status: Inpatient    Briscoe Deutscherimothy S Ismeal Heider, MD [facility]  04/16/2021, 2:48 AM

## 2021-04-16 NOTE — Progress Notes (Signed)
PROGRESS NOTE    Veronica Cortez  ZOX:[id number] DOB: 22-Dec-1970 DOA: 04/15/2021 PCP: Ellyn Hack, MD   Brief Narrative:  Veronica Cortez is a 50 y.o. female with medical history significant for insulin-dependent diabetes mellitus, hypertension, GERD, IBS, hyperlipidemia, migraines, and depression, presenting to emergency department for evaluation of cramping abdominal pain, nausea, and bloody diarrhea.     Assessment & Plan:   Acute colitis  - Presents with one day of cramping abdominal pain and bloody diarrhea and is found to be tachycardic with WBC 19,000 and colitis on CT  - Continue IVF hydration, empiric antibiotics, enteric precautions, follow-up pending GI pathogen panel, monitor electrolytes, trend H&H, and advance diet as she improves   -C. difficile ruled out   Insulin-dependent DM  - Continue CBG checks and insulin    Asthma   - No cough, wheezing, or SOB on admission  - Continue Singulair, ICS/LABA, and as-needed SABA     Hypertension  - Pt unsure of atenolol dose, has orthostasis on admission, will treat as-needed only for now     DVT prophylaxis: SCDs  Code Status: Full  Family Communication: None present   Status is: Inpatient  Dispo: The patient is from: home              Anticipated d/c is to: Home              Anticipated d/c date is: 24 to 48 hours              Patient currently not medically stable for discharge  Consultants:  None  Procedures:  None  Antimicrobials:  None  Subjective: Ongoing diarrhea but improving, still notes bright red blood, painless but denies nausea vomiting or constipation.  Objective: Vitals:   04/16/21 0100 04/16/21 0300 04/16/21 0801 04/16/21 0817  BP: (!) 158/90 134/74 120/73   Pulse: (!) 114 (!) 104 (!) 104   Resp:   18   Temp:  97.8 F (36.6 C) 97.9 F (36.6 C)   TempSrc:  Oral Oral   SpO2: 94% 95% 91% 94%  Weight:  96.9 kg    Height:  5\' 8"  (1.727 m)      Intake/Output Summary (Last 24  hours) at 04/16/2021 0911 Last data filed at 04/16/2021 0400 Gross per 24 hour  Intake 1754.36 ml  Output --  Net 1754.36 ml   Filed Weights   04/15/21 1843 04/16/21 0300  Weight: 96.6 kg 96.9 kg   Examination:  General exam: Appears calm and comfortable  Respiratory system: Clear to auscultation. Respiratory effort normal. Cardiovascular system: S1 & S2 heard, RRR. No JVD, murmurs, rubs, gallops or clicks. No pedal edema. Gastrointestinal system: Abdomen is nondistended, soft and nontender. No organomegaly or masses felt. Normal bowel sounds heard. Central nervous system: Alert and oriented. No focal neurological deficits. Extremities: Symmetric 5 x 5 power. Skin: No rashes, lesions or ulcers Psychiatry: Judgement and insight appear normal. Mood & affect appropriate.   Data Reviewed: I have personally reviewed following labs and imaging studies  CBC: Recent Labs  Lab 04/15/21 2330 04/16/21 0757  WBC 19.0* 14.2*  NEUTROABS 13.7*  --   HGB 13.4 11.8*  HCT 42.3 36.6  MCV 82.5 83.2  PLT 421* 310   Basic Metabolic Panel: Recent Labs  Lab 04/15/21 2300 04/16/21 0524  NA 133* 138  K 5.2* 3.8  CL 104 112*  CO2 18* 19*  GLUCOSE 235* 206*  BUN 14 12  CREATININE 0.81 0.68  CALCIUM 8.9 8.6*   GFR: Estimated Creatinine Clearance: 102.4 mL/min (by C-G formula based on SCr of 0.68 mg/dL). Liver Function Tests: Recent Labs  Lab 04/15/21 2300  AST 40  ALT 8  ALKPHOS 90  BILITOT 1.2  PROT 7.3  ALBUMIN 3.9   Recent Labs  Lab 04/15/21 2300  LIPASE 35   No results for input(s): AMMONIA in the last 168 hours. Coagulation Profile: No results for input(s): INR, PROTIME in the last 168 hours. Cardiac Enzymes: No results for input(s): CKTOTAL, CKMB, CKMBINDEX, TROPONINI in the last 168 hours. BNP (last 3 results) No results for input(s): PROBNP in the last 8760 hours. HbA1C: Recent Labs    04/16/21 0524  HGBA1C 8.6*   CBG: Recent Labs  Lab 04/16/21 0336  04/16/21 0901  GLUCAP 232* 163*   Lipid Profile: No results for input(s): CHOL, HDL, LDLCALC, TRIG, CHOLHDL, LDLDIRECT in the last 72 hours. Thyroid Function Tests: No results for input(s): TSH, T4TOTAL, FREET4, T3FREE, THYROIDAB in the last 72 hours. Anemia Panel: No results for input(s): VITAMINB12, FOLATE, FERRITIN, TIBC, IRON, RETICCTPCT in the last 72 hours. Sepsis Labs: Recent Labs  Lab 04/16/21 0524  LATICACIDVEN 1.5    Recent Results (from the past 240 hour(s))  C Difficile Quick Screen w PCR reflex     Status: None   Collection Time: 04/15/21  7:17 PM   Specimen: STOOL  Result Value Ref Range Status   C Diff antigen NEGATIVE NEGATIVE Final   C Diff toxin NEGATIVE NEGATIVE Final   C Diff interpretation No C. difficile detected.  Final    Comment: Performed at [facility], [address] [place], [place] [address]  Resp Panel by RT-PCR (Flu A&B, Covid) Nasopharyngeal Swab     Status: None   Collection Time: 04/16/21 12:19 AM   Specimen: Nasopharyngeal Swab; Nasopharyngeal(NP) swabs in vial transport medium  Result Value Ref Range Status   SARS Coronavirus 2 by RT PCR NEGATIVE NEGATIVE Final    Comment: (NOTE) SARS-CoV-2 target nucleic acids are NOT DETECTED.  The SARS-CoV-2 RNA is generally detectable in upper respiratory specimens during the acute phase of infection. The lowest concentration of SARS-CoV-2 viral copies this assay can detect is 138 copies/mL. A negative result does not preclude SARS-Cov-2 infection and should not be used as the sole basis for treatment or other patient management decisions. A negative result may occur with  improper specimen collection/handling, submission of specimen other than nasopharyngeal swab, presence of viral mutation(s) within the areas targeted by this assay, and inadequate number of viral copies(<138 copies/mL). A negative result must be combined with clinical observations, patient history, and  epidemiological information. The expected result is Negative.  Fact Sheet for Patients:  BloggerCourse.comhttps://www.fda.gov/media/152166/download  Fact Sheet for Healthcare Providers:  SeriousBroker.ithttps://www.fda.gov/media/152162/download  This test is no t yet approved or cleared by the [place] FDA and  has been authorized for detection and/or diagnosis of SARS-CoV-2 by FDA under an Emergency Use Authorization (EUA). This EUA will remain  in effect (meaning this test can be used) for the duration of the COVID-19 declaration under Section 564(b)(1) of the Act, 21 U.S.C.section 360bbb-3(b)(1), unless the authorization is terminated  or revoked sooner.       Influenza A by PCR NEGATIVE NEGATIVE Final   Influenza B by PCR NEGATIVE NEGATIVE Final    Comment: (NOTE) The Xpert Xpress SARS-CoV-2/FLU/RSV plus assay is intended as an aid in the diagnosis of influenza from Nasopharyngeal swab specimens and should not be  used as a sole basis for treatment. Nasal washings and aspirates are unacceptable for Xpert Xpress SARS-CoV-2/FLU/RSV testing.  Fact Sheet for Patients: BloggerCourse.com  Fact Sheet for Healthcare Providers: SeriousBroker.it  This test is not yet approved or cleared by the [place] FDA and has been authorized for detection and/or diagnosis of SARS-CoV-2 by FDA under an Emergency Use Authorization (EUA). This EUA will remain in effect (meaning this test can be used) for the duration of the COVID-19 declaration under Section 564(b)(1) of the Act, 21 U.S.C. section 360bbb-3(b)(1), unless the authorization is terminated or revoked.  Performed at [facility], [address], [place], [place] [address]     Radiology Studies: CT Abdomen Pelvis W Contrast  Result Date: 04/16/2021 CLINICAL DATA:  Generalized abdominal pain. Nausea and vomiting. Bloody diarrhea. Elevated white blood cell count. EXAM: CT ABDOMEN AND  PELVIS WITH CONTRAST TECHNIQUE: Multidetector CT imaging of the abdomen and pelvis was performed using the standard protocol following bolus administration of intravenous contrast. CONTRAST:  18mL OMNIPAQUE IOHEXOL 350 MG/ML SOLN COMPARISON:  None. FINDINGS: Lower chest: No acute airspace disease or pleural effusion. Heart is normal in size. Hepatobiliary: Borderline hepatic steatosis. No focal hepatic lesion. Questionable layering sludge in the gallbladder. No calcified gallstone. No pericholecystic inflammation. No biliary dilatation. Pancreas: No ductal dilatation or inflammation. Spleen: Upper normal in size spanning 14.2 cm cranial caudal. No focal abnormality. Adrenals/Urinary Tract: Low-density left adrenal nodule measuring 11 mm transverse dimension most consistent with adenoma. Normal right adrenal gland. No hydronephrosis or perinephric edema. Homogeneous renal enhancement with symmetric excretion on delayed phase imaging. No visualized renal calculi. Subcentimeter low-density in the posterior lower left kidney is likely cyst but too small to characterize. Urinary bladder is physiologically distended without wall thickening. Stomach/Bowel: Colonic wall thickening with pericolonic edema extending from the splenic flexure of the colon through the mid sigmoid. There is liquid stool in the left colon. No pneumatosis. Moderate colonic stool burden in the ascending and transverse colon. There is no obvious mass lesion at transition from stool filled to decompressed transverse colon. Normal appendix. Few fluid-filled loops of small bowel in the left abdomen likely reactive. No small bowel obstruction. Unremarkable stomach. Vascular/Lymphatic: Normal caliber abdominal aorta. Patent portal vein. No portal venous or mesenteric gas. Patent portal vein. Few small central mesenteric lymph nodes are not enlarged by size criteria and typically reactive. No enlarged lymph nodes in the abdomen or pelvis. Reproductive:  Hysterectomy.  No adnexal mass. Other: No perforation or abscess. No ascites or free fluid. Trace thickening of the left pericolic gutter is reactive. Minimal fat in the left inguinal canal. Musculoskeletal: L5-S1 degenerative disc disease. There are no acute or suspicious osseous abnormalities. IMPRESSION: 1. Colitis extending from the splenic flexure of the colon through the mid sigmoid colon, likely infectious or inflammatory. No perforation or abscess. 2. Moderate stool in the right colon. While there is no obvious colonic mass at the transition from stool filled decompressed colon, up-to-date colonoscopy is recommended 3. Borderline hepatic steatosis. 4. Left adrenal adenoma. Electronically Signed   By: Narda Rutherford M.D.   On: 04/16/2021 01:37    Scheduled Meds:  FLUoxetine  20 mg Oral Daily   insulin aspart  0-9 Units Subcutaneous Q4H   insulin glargine-yfgn  15 Units Subcutaneous QHS   mometasone-formoterol  2 puff Inhalation BID   pantoprazole  40 mg Oral Daily   Continuous Infusions:  sodium chloride 125 mL/hr at 04/16/21 0318   ciprofloxacin 400 mg (04/16/21  2423)   metronidazole 500 mg (04/16/21 0528)     LOS: 0 days   Time spent:  [name], DO [facility]  If 7PM-7AM, please contact night-coverage [email or web address]  04/16/2021, 9:11 AM

## 2021-04-16 NOTE — ED Provider Notes (Signed)
I assumed care of this patient.  Please see previous provider note for further details of Hx, PE.  Briefly patient is a 50 y.o. female who presented nausea, vomiting, bloody diarrhea.  CBC notable for leukocytosis.  Metabolic panel without renal insufficiency, biliary obstruction or pancreatitis.  Notable for hyperglycemia without evidence of DKA.  UA without evidence of infection.  COVID-negative.    Required peripheral IV placement by ultrasound due to multiple failed attempts and need for CT scan   Ultrasound ED Peripheral IV (Provider)  Date/Time: 04/16/2021 6:25 AM Performed by: Nira Conn, MD Authorized by: Nira Conn, MD   Procedure details:    Indications: multiple failed IV attempts     Skin Prep: chlorhexidine gluconate     Location:  Left AC   Angiocath:  20 G   Bedside Ultrasound Guided: Yes     Images: archived     Patient tolerated procedure without complications: Yes     Dressing applied: Yes     CT scan notable for colitis.  Patient started on antibiotics and admitted to medicine for further work-up and management.   Nira Conn, MD 04/16/21 [phone]

## 2021-04-16 NOTE — Progress Notes (Signed)
Pharmacy Antibiotic Note  Veronica Cortez is a 50 y.o. female admitted on 04/15/2021 with generalized abdominal pain, bloody diarrhea, and vomiting since early morning.  Pharmacy has been consulted to dose cipro for intra-abdominal infection.  Plan: Cipro 400mg  IV q12h Follow renal function and clinical course  Height: 5\' 8"  (172.7 cm) Weight: 96.6 kg (213 lb) IBW/kg (Calculated) : 63.9  Temp (24hrs), Avg:97.8 F (36.6 C), Min:97.8 F (36.6 C), Max:97.8 F (36.6 C)  Recent Labs  Lab 04/15/21 2300 04/15/21 2330  WBC  --  19.0*  CREATININE 0.81  --     Estimated Creatinine Clearance: 101 mL/min (by C-G formula based on SCr of 0.81 mg/dL).    Allergies  Allergen Reactions   Benadryl [Diphenhydramine]    Compazine [Prochlorperazine]    Lamictal [Lamotrigine]    Other     Triptans and statins   Reglan [Metoclopramide]       Thank you for allowing pharmacy to be a part of this patient's care.  06/15/21 RPh 04/16/2021, 2:34 AM

## 2021-04-17 LAB — CBC
HCT: 31.5 % — ABNORMAL LOW (ref 36.0–46.0)
Hemoglobin: 9.9 g/dL — ABNORMAL LOW (ref 12.0–15.0)
MCH: 26.1 pg (ref 26.0–34.0)
MCHC: 31.4 g/dL (ref 30.0–36.0)
MCV: 83.1 fL (ref 80.0–100.0)
Platelets: 264 10*3/uL (ref 150–400)
RBC: 3.79 MIL/uL — ABNORMAL LOW (ref 3.87–5.11)
RDW: 14 % (ref 11.5–15.5)
WBC: 10.9 10*3/uL — ABNORMAL HIGH (ref 4.0–10.5)
nRBC: 0 % (ref 0.0–0.2)

## 2021-04-17 LAB — GLUCOSE, CAPILLARY
Glucose-Capillary: 123 mg/dL — ABNORMAL HIGH (ref 70–99)
Glucose-Capillary: 143 mg/dL — ABNORMAL HIGH (ref 70–99)
Glucose-Capillary: 169 mg/dL — ABNORMAL HIGH (ref 70–99)
Glucose-Capillary: 190 mg/dL — ABNORMAL HIGH (ref 70–99)
Glucose-Capillary: 216 mg/dL — ABNORMAL HIGH (ref 70–99)
Glucose-Capillary: 239 mg/dL — ABNORMAL HIGH (ref 70–99)

## 2021-04-17 LAB — BASIC METABOLIC PANEL
Anion gap: 6 (ref 5–15)
BUN: 9 mg/dL (ref 6–20)
CO2: 20 mmol/L — ABNORMAL LOW (ref 22–32)
Calcium: 8.4 mg/dL — ABNORMAL LOW (ref 8.9–10.3)
Chloride: 113 mmol/L — ABNORMAL HIGH (ref 98–111)
Creatinine, Ser: 0.65 mg/dL (ref 0.44–1.00)
GFR, Estimated: >60 mL/min (ref >60)
Glucose, Bld: 187 mg/dL — ABNORMAL HIGH (ref 70–99)
Potassium: 3.7 mmol/L (ref 3.5–5.1)
Sodium: 139 mmol/L (ref 135–145)

## 2021-04-17 MED ORDER — DICYCLOMINE HCL 10 MG PO CAPS
10.0000 mg | ORAL_CAPSULE | Freq: Three times a day (TID) | ORAL | Status: DC
  Administered 2021-04-17 – 2021-04-18 (×3): 10 mg via ORAL
  Filled 2021-04-17 (×4): qty 1

## 2021-04-17 MED ORDER — SODIUM CHLORIDE 0.9% FLUSH: 10.0000 mL | INTRAVENOUS | Status: DC | PRN

## 2021-04-17 MED ORDER — SODIUM CHLORIDE 0.9% FLUSH
10.0000 mL | Freq: Two times a day (BID) | INTRAVENOUS | Status: DC
  Administered 2021-04-18: 10 mL

## 2021-04-17 MED ORDER — HYDROMORPHONE HCL 1 MG/ML IJ SOLN
0.5000 mg | Freq: Four times a day (QID) | INTRAMUSCULAR | Status: DC | PRN
  Administered 2021-04-17 – 2021-04-18 (×3): 0.5 mg via INTRAVENOUS
  Filled 2021-04-17 (×3): qty 0.5

## 2021-04-17 MED ORDER — SIMETHICONE 80 MG PO CHEW
80.0000 mg | CHEWABLE_TABLET | Freq: Four times a day (QID) | ORAL | Status: DC | PRN
  Administered 2021-04-17 – 2021-04-18 (×2): 80 mg via ORAL
  Filled 2021-04-17 (×2): qty 1

## 2021-04-17 NOTE — Discharge Summary (Signed)
Physician Discharge Summary  Simon Aaberg RUE:[id number] DOB: 30-Aug-1970 DOA: 04/15/2021  PCP: Ellyn Hack, MD  Admit date: 04/15/2021 Discharge date: 04/18/2021  Admitted From: Home Disposition:  Home  Recommendations for Outpatient Follow-up:  Follow up with PCP in 1-2 weeks Please obtain BMP/CBC in one week  Home Health:None  Equipment/Devices:None  Discharge Condition:Stable  CODE STATUS:Full  Diet recommendation: Soft/Bland diet - advance as tolerated    Brief/Interim Summary: Veronica Cortez is a 50 y.o. female with medical history significant for insulin-dependent diabetes mellitus, hypertension, GERD, IBS, hyperlipidemia, migraines, and depression, presenting to emergency department for evaluation of cramping abdominal pain, nausea, and bloody diarrhea.      Assessment & Plan:   Sepsis secondary to acute infectious colitis, POA  - Noted on CT - Bloody diarrhea resolving - Pathogen/Cdiff panel negative - likely viral with mucosal inflammation vs hemorrhoidal given her history of IBS and hemorrhoids. Last scope within 5 years. - Continue supportive care, advance diet as tolerated   Insulin-dependent DM  - Continue CBG checks and insulin    Asthma   - No cough, wheezing, or SOB on admission  - Continue Singulair, ICS/LABA, and as-needed SABA     Hypertension  - Stop atenolol given well controlled here without home medication  Discharge Instructions  Discharge Instructions     Call MD for:  severe uncontrolled pain   Complete by: As directed    Diet - low sodium heart healthy   Complete by: As directed    Increase activity slowly   Complete by: As directed       Allergies as of 04/18/2021       Reactions   Benadryl [diphenhydramine]    Compazine [prochlorperazine]    Lamictal [lamotrigine]    Other    Triptans and statins   Reglan [metoclopramide]         Medication List     TAKE these medications    Advair HFA 115-21 MCG/ACT  inhaler Generic drug: fluticasone-salmeterol Inhale 2 puffs into the lungs 2 (two) times daily.   Ajovy 225 MG/1.5ML Soaj Generic drug: Fremanezumab-vfrm Inject 1.5 mLs into the skin every 30 (thirty) days.   albuterol 108 (90 Base) MCG/ACT inhaler Commonly known as: VENTOLIN HFA Inhale 2 puffs into the lungs every 4 (four) hours as needed for wheezing or shortness of breath.   amitriptyline 100 MG tablet Commonly known as: ELAVIL Take 100 mg by mouth at bedtime.   aspirin-acetaminophen-caffeine 250-250-65 MG tablet Commonly known as: EXCEDRIN MIGRAINE Take 2 tablets by mouth every 6 (six) hours as needed for headache.   atenolol 25 MG tablet Commonly known as: TENORMIN Take 25 mg by mouth daily.   B2 PO Take 1 tablet by mouth daily.   ciprofloxacin 500 MG tablet Commonly known as: Cipro Take 1 tablet (500 mg total) by mouth 2 (two) times daily for 3 days.   dicyclomine 10 MG capsule Commonly known as: BENTYL Take 1 capsule (10 mg total) by mouth 3 (three) times daily before meals. What changed:  when to take this reasons to take this   FISH OIL PO Take 1 capsule by mouth daily.   FLUoxetine 40 MG capsule Commonly known as: PROZAC Take 40 mg by mouth daily.   HYDROcodone-acetaminophen 5-325 MG tablet Commonly known as: NORCO/VICODIN Take 1-2 tablets by mouth every 6 (six) hours as needed for up to 3 days for moderate pain.   Insulin Aspart FlexPen 100 UNIT/ML Commonly known as: NOVOLOG Inject  10-16 Units into the skin 3 (three) times daily.   Januvia 100 MG tablet Generic drug: sitaGLIPtin Take 100 mg by mouth daily.   Lantus SoloStar 100 UNIT/ML Solostar Pen Generic drug: insulin glargine Inject 30 Units into the skin at bedtime.   Melatonin 10 MG Tabs Take 10 mg by mouth at bedtime.   metFORMIN 1000 MG tablet Commonly known as: GLUCOPHAGE Take 1,000 mg by mouth 2 (two) times daily.   montelukast 10 MG tablet Commonly known as: SINGULAIR Take 10  mg by mouth daily.   omeprazole 20 MG capsule Commonly known as: PRILOSEC Take 20 mg by mouth 2 (two) times daily.   PROBIOTIC PO Take 1 capsule by mouth daily.   promethazine 25 MG tablet Commonly known as: PHENERGAN Take 25 mg by mouth daily as needed for nausea/vomiting.   rosuvastatin 10 MG tablet Commonly known as: CRESTOR Take 10 mg by mouth at bedtime.   topiramate 200 MG tablet Commonly known as: TOPAMAX Take 200 mg by mouth 2 (two) times daily.   Ubrelvy 100 MG Tabs Generic drug: Ubrogepant Take 100 mg by mouth 2 (two) times daily as needed for migraine.   VITAMIN E PO Take 1 tablet by mouth daily.        Allergies  Allergen Reactions   Benadryl [Diphenhydramine]    Compazine [Prochlorperazine]    Lamictal [Lamotrigine]    Other     Triptans and statins   Reglan [Metoclopramide]     Consultations: None  Procedures/Studies: CT Abdomen Pelvis W Contrast  Result Date: 04/16/2021 CLINICAL DATA:  Generalized abdominal pain. Nausea and vomiting. Bloody diarrhea. Elevated white blood cell count. EXAM: CT ABDOMEN AND PELVIS WITH CONTRAST TECHNIQUE: Multidetector CT imaging of the abdomen and pelvis was performed using the standard protocol following bolus administration of intravenous contrast. CONTRAST:  53mL OMNIPAQUE IOHEXOL 350 MG/ML SOLN COMPARISON:  None. FINDINGS: Lower chest: No acute airspace disease or pleural effusion. Heart is normal in size. Hepatobiliary: Borderline hepatic steatosis. No focal hepatic lesion. Questionable layering sludge in the gallbladder. No calcified gallstone. No pericholecystic inflammation. No biliary dilatation. Pancreas: No ductal dilatation or inflammation. Spleen: Upper normal in size spanning 14.2 cm cranial caudal. No focal abnormality. Adrenals/Urinary Tract: Low-density left adrenal nodule measuring 11 mm transverse dimension most consistent with adenoma. Normal right adrenal gland. No hydronephrosis or perinephric edema.  Homogeneous renal enhancement with symmetric excretion on delayed phase imaging. No visualized renal calculi. Subcentimeter low-density in the posterior lower left kidney is likely cyst but too small to characterize. Urinary bladder is physiologically distended without wall thickening. Stomach/Bowel: Colonic wall thickening with pericolonic edema extending from the splenic flexure of the colon through the mid sigmoid. There is liquid stool in the left colon. No pneumatosis. Moderate colonic stool burden in the ascending and transverse colon. There is no obvious mass lesion at transition from stool filled to decompressed transverse colon. Normal appendix. Few fluid-filled loops of small bowel in the left abdomen likely reactive. No small bowel obstruction. Unremarkable stomach. Vascular/Lymphatic: Normal caliber abdominal aorta. Patent portal vein. No portal venous or mesenteric gas. Patent portal vein. Few small central mesenteric lymph nodes are not enlarged by size criteria and typically reactive. No enlarged lymph nodes in the abdomen or pelvis. Reproductive: Hysterectomy.  No adnexal mass. Other: No perforation or abscess. No ascites or free fluid. Trace thickening of the left pericolic gutter is reactive. Minimal fat in the left inguinal canal. Musculoskeletal: L5-S1 degenerative disc disease. There are no acute or  suspicious osseous abnormalities. IMPRESSION: 1. Colitis extending from the splenic flexure of the colon through the mid sigmoid colon, likely infectious or inflammatory. No perforation or abscess. 2. Moderate stool in the right colon. While there is no obvious colonic mass at the transition from stool filled decompressed colon, up-to-date colonoscopy is recommended 3. Borderline hepatic steatosis. 4. Left adrenal adenoma. Electronically Signed   By: Narda Rutherford M.D.   On: 04/16/2021 01:37     Subjective: No acute issues/events overnight   Discharge Exam: Vitals:   04/17/21 2004 04/18/21  0408  BP: (!) 122/58 124/76  Pulse: 93 98  Resp: 16 19  Temp: 98.4 F (36.9 C) 98.3 F (36.8 C)  SpO2: 98% 100%   Vitals:   04/17/21 0828 04/17/21 1633 04/17/21 2004 04/18/21 0408  BP:   (!) 122/58 124/76  Pulse:   93 98  Resp:   16 19  Temp:   98.4 F (36.9 C) 98.3 F (36.8 C)  TempSrc:      SpO2: 97% 97% 98% 100%  Weight:      Height:        General: Pt is alert, awake, not in acute distress Cardiovascular: RRR, S1/S2 +, no rubs, no gallops Respiratory: CTA bilaterally, no wheezing, no rhonchi Abdominal: Soft, NT, ND, bowel sounds + Extremities: no edema, no cyanosis    The results of significant diagnostics from this hospitalization (including imaging, microbiology, ancillary and laboratory) are listed below for reference.     Microbiology: Recent Results (from the past 240 hour(s))  Gastrointestinal Panel by PCR , Stool     Status: None   Collection Time: 04/15/21  7:17 PM   Specimen: STOOL  Result Value Ref Range Status   Campylobacter species NOT DETECTED NOT DETECTED Final   Plesimonas shigelloides NOT DETECTED NOT DETECTED Final   Salmonella species NOT DETECTED NOT DETECTED Final   Yersinia enterocolitica NOT DETECTED NOT DETECTED Final   Vibrio species NOT DETECTED NOT DETECTED Final   Vibrio cholerae NOT DETECTED NOT DETECTED Final   Enteroaggregative E coli (EAEC) NOT DETECTED NOT DETECTED Final   Enteropathogenic E coli (EPEC) NOT DETECTED NOT DETECTED Final   Enterotoxigenic E coli (ETEC) NOT DETECTED NOT DETECTED Final   Shiga like toxin producing E coli (STEC) NOT DETECTED NOT DETECTED Final   Shigella/Enteroinvasive E coli (EIEC) NOT DETECTED NOT DETECTED Final   Cryptosporidium NOT DETECTED NOT DETECTED Final   Cyclospora cayetanensis NOT DETECTED NOT DETECTED Final   Entamoeba histolytica NOT DETECTED NOT DETECTED Final   Giardia lamblia NOT DETECTED NOT DETECTED Final   Adenovirus F40/41 NOT DETECTED NOT DETECTED Final   Astrovirus NOT  DETECTED NOT DETECTED Final   Norovirus GI/GII NOT DETECTED NOT DETECTED Final   Rotavirus A NOT DETECTED NOT DETECTED Final   Sapovirus (I, II, IV, and V) NOT DETECTED NOT DETECTED Final    Comment: Performed at [facility], [address] [place], [place] [address]  C Difficile Quick Screen w PCR reflex     Status: None   Collection Time: 04/15/21  7:17 PM   Specimen: STOOL  Result Value Ref Range Status   C Diff antigen NEGATIVE NEGATIVE Final   C Diff toxin NEGATIVE NEGATIVE Final   C Diff interpretation No C. difficile detected.  Final    Comment: Performed at [facility], [address] [place], [place] [address]  Resp Panel by RT-PCR (Flu A&B, Covid) Nasopharyngeal Swab     Status: None  Collection Time: 04/16/21 12:19 AM   Specimen: Nasopharyngeal Swab; Nasopharyngeal(NP) swabs in vial transport medium  Result Value Ref Range Status   SARS Coronavirus 2 by RT PCR NEGATIVE NEGATIVE Final    Comment: (NOTE) SARS-CoV-2 target nucleic acids are NOT DETECTED.  The SARS-CoV-2 RNA is generally detectable in upper respiratory specimens during the acute phase of infection. The lowest concentration of SARS-CoV-2 viral copies this assay can detect is 138 copies/mL. A negative result does not preclude SARS-Cov-2 infection and should not be used as the sole basis for treatment or other patient management decisions. A negative result may occur with  improper specimen collection/handling, submission of specimen other than nasopharyngeal swab, presence of viral mutation(s) within the areas targeted by this assay, and inadequate number of viral copies(<138 copies/mL). A negative result must be combined with clinical observations, patient history, and epidemiological information. The expected result is Negative.  Fact Sheet for Patients:  BloggerCourse.com  Fact Sheet for Healthcare Providers:   SeriousBroker.it  This test is no t yet approved or cleared by the [place] FDA and  has been authorized for detection and/or diagnosis of SARS-CoV-2 by FDA under an Emergency Use Authorization (EUA). This EUA will remain  in effect (meaning this test can be used) for the duration of the COVID-19 declaration under Section 564(b)(1) of the Act, 21 U.S.C.section 360bbb-3(b)(1), unless the authorization is terminated  or revoked sooner.       Influenza A by PCR NEGATIVE NEGATIVE Final   Influenza B by PCR NEGATIVE NEGATIVE Final    Comment: (NOTE) The Xpert Xpress SARS-CoV-2/FLU/RSV plus assay is intended as an aid in the diagnosis of influenza from Nasopharyngeal swab specimens and should not be used as a sole basis for treatment. Nasal washings and aspirates are unacceptable for Xpert Xpress SARS-CoV-2/FLU/RSV testing.  Fact Sheet for Patients: BloggerCourse.com  Fact Sheet for Healthcare Providers: SeriousBroker.it  This test is not yet approved or cleared by the [place] FDA and has been authorized for detection and/or diagnosis of SARS-CoV-2 by FDA under an Emergency Use Authorization (EUA). This EUA will remain in effect (meaning this test can be used) for the duration of the COVID-19 declaration under Section 564(b)(1) of the Act, 21 U.S.C. section 360bbb-3(b)(1), unless the authorization is terminated or revoked.  Performed at [facility], [address], [place], [place] [address]      Labs: BNP (last 3 results) No results for input(s): BNP in the last 8760 hours. Basic Metabolic Panel: Recent Labs  Lab 04/15/21 2300 04/16/21 0524 04/17/21 0453 04/18/21 0521  NA 133* 138 139 141  K 5.2* 3.8 3.7 4.0  CL 104 112* 113* 110  CO2 18* 19* 20* 24  GLUCOSE 235* 206* 187* 207*  BUN CREATININE 0.81 0.68 0.65 0.71  CALCIUM 8.9 8.6* 8.4* 9.0    Liver Function Tests: Recent Labs  Lab 04/15/21 2300  AST 40  ALT 8  ALKPHOS 90  BILITOT 1.2  PROT 7.3  ALBUMIN 3.9   Recent Labs  Lab 04/15/21 2300  LIPASE 35   No results for input(s): AMMONIA in the last 168 hours. CBC: Recent Labs  Lab 04/15/21 2330 04/16/21 0757 04/17/21 0453 04/18/21 0521  WBC 19.0* 14.2* 10.9* 9.2  NEUTROABS 13.7*  --   --   --   HGB 13.4 11.8* 9.9* 10.1*  HCT 42.3 36.6 31.5* 32.0*  MCV 82.5 83.2 83.1 83.8  PLT 421* 310 264 278   Cardiac  Enzymes: No results for input(s): CKTOTAL, CKMB, CKMBINDEX, TROPONINI in the last 168 hours. BNP: Invalid input(s): POCBNP CBG: Recent Labs  Lab 04/17/21 1211 04/17/21 1724 04/17/21 2006 04/17/21 2343 04/18/21 0409  GLUCAP 216* 169* 190* 123* 218*   D-Dimer No results for input(s): DDIMER in the last 72 hours. Hgb A1c Recent Labs    04/16/21 0524  HGBA1C 8.6*   Lipid Profile No results for input(s): CHOL, HDL, LDLCALC, TRIG, CHOLHDL, LDLDIRECT in the last 72 hours. Thyroid function studies No results for input(s): TSH, T4TOTAL, T3FREE, THYROIDAB in the last 72 hours.  Invalid input(s): FREET3 Anemia work up No results for input(s): VITAMINB12, FOLATE, FERRITIN, TIBC, IRON, RETICCTPCT in the last 72 hours. Urinalysis    Component Value Date/Time   COLORURINE YELLOW 04/15/2021 2140   APPEARANCEUR CLEAR 04/15/2021 2140   LABSPEC 1.010 04/15/2021 2140   PHURINE 6.5 04/15/2021 2140   GLUCOSEU 250 (A) 04/15/2021 2140   HGBUR NEGATIVE 04/15/2021 2140   BILIRUBINUR NEGATIVE 04/15/2021 2140   KETONESUR NEGATIVE 04/15/2021 2140   PROTEINUR NEGATIVE 04/15/2021 2140   NITRITE NEGATIVE 04/15/2021 2140   LEUKOCYTESUR NEGATIVE 04/15/2021 2140   Sepsis Labs Invalid input(s): PROCALCITONIN,  WBC,  LACTICIDVEN Microbiology Recent Results (from the past 240 hour(s))  Gastrointestinal Panel by PCR , Stool     Status: None   Collection Time: 04/15/21  7:17 PM   Specimen: STOOL  Result Value Ref  Range Status   Campylobacter species NOT DETECTED NOT DETECTED Final   Plesimonas shigelloides NOT DETECTED NOT DETECTED Final   Salmonella species NOT DETECTED NOT DETECTED Final   Yersinia enterocolitica NOT DETECTED NOT DETECTED Final   Vibrio species NOT DETECTED NOT DETECTED Final   Vibrio cholerae NOT DETECTED NOT DETECTED Final   Enteroaggregative E coli (EAEC) NOT DETECTED NOT DETECTED Final   Enteropathogenic E coli (EPEC) NOT DETECTED NOT DETECTED Final   Enterotoxigenic E coli (ETEC) NOT DETECTED NOT DETECTED Final   Shiga like toxin producing E coli (STEC) NOT DETECTED NOT DETECTED Final   Shigella/Enteroinvasive E coli (EIEC) NOT DETECTED NOT DETECTED Final   Cryptosporidium NOT DETECTED NOT DETECTED Final   Cyclospora cayetanensis NOT DETECTED NOT DETECTED Final   Entamoeba histolytica NOT DETECTED NOT DETECTED Final   Giardia lamblia NOT DETECTED NOT DETECTED Final   Adenovirus F40/41 NOT DETECTED NOT DETECTED Final   Astrovirus NOT DETECTED NOT DETECTED Final   Norovirus GI/GII NOT DETECTED NOT DETECTED Final   Rotavirus A NOT DETECTED NOT DETECTED Final   Sapovirus (I, II, IV, and V) NOT DETECTED NOT DETECTED Final    Comment: Performed at [facility], [address] [place], [place] [address]  C Difficile Quick Screen w PCR reflex     Status: None   Collection Time: 04/15/21  7:17 PM   Specimen: STOOL  Result Value Ref Range Status   C Diff antigen NEGATIVE NEGATIVE Final   C Diff toxin NEGATIVE NEGATIVE Final   C Diff interpretation No C. difficile detected.  Final    Comment: Performed at [facility], [address], [place], [place] [address]  Resp Panel by RT-PCR (Flu A&B, Covid) Nasopharyngeal Swab     Status: None   Collection Time: 04/16/21 12:19 AM   Specimen: Nasopharyngeal Swab; Nasopharyngeal(NP) swabs in vial transport medium  Result Value Ref Range Status   SARS Coronavirus 2 by RT PCR NEGATIVE NEGATIVE Final     Comment: (NOTE) SARS-CoV-2 target nucleic acids are NOT DETECTED.  The  SARS-CoV-2 RNA is generally detectable in upper respiratory specimens during the acute phase of infection. The lowest concentration of SARS-CoV-2 viral copies this assay can detect is 138 copies/mL. A negative result does not preclude SARS-Cov-2 infection and should not be used as the sole basis for treatment or other patient management decisions. A negative result may occur with  improper specimen collection/handling, submission of specimen other than nasopharyngeal swab, presence of viral mutation(s) within the areas targeted by this assay, and inadequate number of viral copies(<138 copies/mL). A negative result must be combined with clinical observations, patient history, and epidemiological information. The expected result is Negative.  Fact Sheet for Patients:  BloggerCourse.comhttps://www.fda.gov/media/152166/download  Fact Sheet for Healthcare Providers:  SeriousBroker.ithttps://www.fda.gov/media/152162/download  This test is no t yet approved or cleared by the [place] FDA and  has been authorized for detection and/or diagnosis of SARS-CoV-2 by FDA under an Emergency Use Authorization (EUA). This EUA will remain  in effect (meaning this test can be used) for the duration of the COVID-19 declaration under Section 564(b)(1) of the Act, 21 U.S.C.section 360bbb-3(b)(1), unless the authorization is terminated  or revoked sooner.       Influenza A by PCR NEGATIVE NEGATIVE Final   Influenza B by PCR NEGATIVE NEGATIVE Final    Comment: (NOTE) The Xpert Xpress SARS-CoV-2/FLU/RSV plus assay is intended as an aid in the diagnosis of influenza from Nasopharyngeal swab specimens and should not be used as a sole basis for treatment. Nasal washings and aspirates are unacceptable for Xpert Xpress SARS-CoV-2/FLU/RSV testing.  Fact Sheet for Patients: BloggerCourse.comhttps://www.fda.gov/media/152166/download  Fact Sheet for Healthcare  Providers: SeriousBroker.ithttps://www.fda.gov/media/152162/download  This test is not yet approved or cleared by the [place] FDA and has been authorized for detection and/or diagnosis of SARS-CoV-2 by FDA under an Emergency Use Authorization (EUA). This EUA will remain in effect (meaning this test can be used) for the duration of the COVID-19 declaration under Section 564(b)(1) of the Act, 21 U.S.C. section 360bbb-3(b)(1), unless the authorization is terminated or revoked.  Performed at [facility], [address] [place], [place] [id number]      Time coordinating discharge: Over 30 minutes  SIGNED:   Azucena FallenWilliam C Treyana Sturgell, DO [facility] 04/18/2021, 7:50 AM Pager   If 7PM-7AM, please contact night-coverage [email or web address]

## 2021-04-17 NOTE — Progress Notes (Signed)
PROGRESS NOTE    Veronica Cortez  OBS:[id number] DOB: 07-Sep-1970 DOA: 04/15/2021 PCP: Ellyn Hack, MD   Brief Narrative:  Veronica Cortez is a 50 y.o. female with medical history significant for insulin-dependent diabetes mellitus, hypertension, GERD, IBS, hyperlipidemia, migraines, and depression, presenting to emergency department for evaluation of cramping abdominal pain, nausea, and bloody diarrhea.     Assessment & Plan:   Sepsis secondary to acute infectious colitis, POA  - Noted on CT - Bloody diarrhea improving - Pathogen/Cdiff panel negative - likely viral with mucosal inflammation vs hemorrhoidal given her history of IBS and hemorrhoids. Last scope within 5 years. - Continue supportive care, advance diet as tolerated   Insulin-dependent DM  - Continue CBG checks and insulin    Asthma   - No cough, wheezing, or SOB on admission  - Continue Singulair, ICS/LABA, and as-needed SABA     Hypertension  - Stop atenolol given well controlled here without home medication   DVT prophylaxis: SCDs  Code Status: Full  Family Communication: None present   Status is: Inpatient  Dispo: The patient is from: home              Anticipated d/c is to: Home              Anticipated d/c date is: 24 hours              Patient currently not medically stable for discharge  Consultants:  None  Procedures:  None  Antimicrobials:  None  Subjective: Ongoing diarrhea but improving, maroon/dark stool consistent with resolving bleeding. Otherwise denies headache, fevers, chills, chest pain, shortness of breath.  Objective: Vitals:   04/16/21 1539 04/16/21 1929 04/16/21 2022 04/17/21 0510  BP: 113/64  130/83 115/75  Pulse: 98  100 90  Resp: 18  20 20   Temp: 98.3 F (36.8 C)  98.3 F (36.8 C) 98.6 F (37 C)  TempSrc: Oral  Oral Oral  SpO2: 97% 98% 98% 97%  Weight:      Height:        Intake/Output Summary (Last 24 hours) at 04/17/2021 0825 Last data filed at  04/17/2021 0600 Gross per 24 hour  Intake 2181.49 ml  Output 1 ml  Net 2180.49 ml    Filed Weights   04/15/21 1843 04/16/21 0300  Weight: 96.6 kg 96.9 kg   Examination:  General exam: Appears calm and comfortable  Respiratory system: Clear to auscultation. Respiratory effort normal. Cardiovascular system: S1 & S2 heard, RRR. No JVD, murmurs, rubs, gallops or clicks. No pedal edema. Gastrointestinal system: Abdomen is nondistended, soft and nontender. No organomegaly or masses felt. Normal bowel sounds heard. Central nervous system: Alert and oriented. No focal neurological deficits. Extremities: Symmetric 5 x 5 power. Skin: No rashes, lesions or ulcers Psychiatry: Judgement and insight appear normal. Mood & affect appropriate.   Data Reviewed: I have personally reviewed following labs and imaging studies  CBC: Recent Labs  Lab 04/15/21 2330 04/16/21 0757 04/17/21 0453  WBC 19.0* 14.2* 10.9*  NEUTROABS 13.7*  --   --   HGB 13.4 11.8* 9.9*  HCT 42.3 36.6 31.5*  MCV 82.5 83.2 83.1  PLT 421* 310 264    Basic Metabolic Panel: Recent Labs  Lab 04/15/21 2300 04/16/21 0524 04/17/21 0453  NA 133* 138 139  K 5.2* 3.8 3.7  CL 104 112* 113*  CO2 18* 19* 20*  GLUCOSE 235* 206* 187*  BUN 14 12 9   CREATININE 0.81 0.68  0.65  CALCIUM 8.9 8.6* 8.4*    GFR: Estimated Creatinine Clearance: 102.4 mL/min (by C-G formula based on SCr of 0.65 mg/dL). Liver Function Tests: Recent Labs  Lab 04/15/21 2300  AST 40  ALT 8  ALKPHOS 90  BILITOT 1.2  PROT 7.3  ALBUMIN 3.9    Recent Labs  Lab 04/15/21 2300  LIPASE 35    No results for input(s): AMMONIA in the last 168 hours. Coagulation Profile: No results for input(s): INR, PROTIME in the last 168 hours. Cardiac Enzymes: No results for input(s): CKTOTAL, CKMB, CKMBINDEX, TROPONINI in the last 168 hours. BNP (last 3 results) No results for input(s): PROBNP in the last 8760 hours. HbA1C: Recent Labs    04/16/21 0524   HGBA1C 8.6*    CBG: Recent Labs  Lab 04/16/21 1608 04/16/21 2024 04/16/21 2308 04/17/21 0319 04/17/21 0801  GLUCAP 267* 195* 193* 239* 143*    Lipid Profile: No results for input(s): CHOL, HDL, LDLCALC, TRIG, CHOLHDL, LDLDIRECT in the last 72 hours. Thyroid Function Tests: No results for input(s): TSH, T4TOTAL, FREET4, T3FREE, THYROIDAB in the last 72 hours. Anemia Panel: No results for input(s): VITAMINB12, FOLATE, FERRITIN, TIBC, IRON, RETICCTPCT in the last 72 hours. Sepsis Labs: Recent Labs  Lab 04/16/21 0524  LATICACIDVEN 1.5     Recent Results (from the past 240 hour(s))  Gastrointestinal Panel by PCR , Stool     Status: None   Collection Time: 04/15/21  7:17 PM   Specimen: STOOL  Result Value Ref Range Status   Campylobacter species NOT DETECTED NOT DETECTED Final   Plesimonas shigelloides NOT DETECTED NOT DETECTED Final   Salmonella species NOT DETECTED NOT DETECTED Final   Yersinia enterocolitica NOT DETECTED NOT DETECTED Final   Vibrio species NOT DETECTED NOT DETECTED Final   Vibrio cholerae NOT DETECTED NOT DETECTED Final   Enteroaggregative E coli (EAEC) NOT DETECTED NOT DETECTED Final   Enteropathogenic E coli (EPEC) NOT DETECTED NOT DETECTED Final   Enterotoxigenic E coli (ETEC) NOT DETECTED NOT DETECTED Final   Shiga like toxin producing E coli (STEC) NOT DETECTED NOT DETECTED Final   Shigella/Enteroinvasive E coli (EIEC) NOT DETECTED NOT DETECTED Final   Cryptosporidium NOT DETECTED NOT DETECTED Final   Cyclospora cayetanensis NOT DETECTED NOT DETECTED Final   Entamoeba histolytica NOT DETECTED NOT DETECTED Final   Giardia lamblia NOT DETECTED NOT DETECTED Final   Adenovirus F40/41 NOT DETECTED NOT DETECTED Final   Astrovirus NOT DETECTED NOT DETECTED Final   Norovirus GI/GII NOT DETECTED NOT DETECTED Final   Rotavirus A NOT DETECTED NOT DETECTED Final   Sapovirus (I, II, IV, and V) NOT DETECTED NOT DETECTED Final    Comment: Performed at  [facility], [address] [place], [place] [address]  C Difficile Quick Screen w PCR reflex     Status: None   Collection Time: 04/15/21  7:17 PM   Specimen: STOOL  Result Value Ref Range Status   C Diff antigen NEGATIVE NEGATIVE Final   C Diff toxin NEGATIVE NEGATIVE Final   C Diff interpretation No C. difficile detected.  Final    Comment: Performed at [facility], [address], [place], [place] [address]  Resp Panel by RT-PCR (Flu A&B, Covid) Nasopharyngeal Swab     Status: None   Collection Time: 04/16/21 12:19 AM   Specimen: Nasopharyngeal Swab; Nasopharyngeal(NP) swabs in vial transport medium  Result Value Ref Range Status   SARS Coronavirus 2 by RT PCR NEGATIVE NEGATIVE  Final    Comment: (NOTE) SARS-CoV-2 target nucleic acids are NOT DETECTED.  The SARS-CoV-2 RNA is generally detectable in upper respiratory specimens during the acute phase of infection. The lowest concentration of SARS-CoV-2 viral copies this assay can detect is 138 copies/mL. A negative result does not preclude SARS-Cov-2 infection and should not be used as the sole basis for treatment or other patient management decisions. A negative result may occur with  improper specimen collection/handling, submission of specimen other than nasopharyngeal swab, presence of viral mutation(s) within the areas targeted by this assay, and inadequate number of viral copies(<138 copies/mL). A negative result must be combined with clinical observations, patient history, and epidemiological information. The expected result is Negative.  Fact Sheet for Patients:  BloggerCourse.comhttps://www.fda.gov/media/152166/download  Fact Sheet for Healthcare Providers:  SeriousBroker.ithttps://www.fda.gov/media/152162/download  This test is no t yet approved or cleared by the [place] FDA and  has been authorized for detection and/or diagnosis of SARS-CoV-2 by FDA under an Emergency Use Authorization (EUA). This EUA will  remain  in effect (meaning this test can be used) for the duration of the COVID-19 declaration under Section 564(b)(1) of the Act, 21 U.S.C.section 360bbb-3(b)(1), unless the authorization is terminated  or revoked sooner.       Influenza A by PCR NEGATIVE NEGATIVE Final   Influenza B by PCR NEGATIVE NEGATIVE Final    Comment: (NOTE) The Xpert Xpress SARS-CoV-2/FLU/RSV plus assay is intended as an aid in the diagnosis of influenza from Nasopharyngeal swab specimens and should not be used as a sole basis for treatment. Nasal washings and aspirates are unacceptable for Xpert Xpress SARS-CoV-2/FLU/RSV testing.  Fact Sheet for Patients: BloggerCourse.comhttps://www.fda.gov/media/152166/download  Fact Sheet for Healthcare Providers: SeriousBroker.ithttps://www.fda.gov/media/152162/download  This test is not yet approved or cleared by the [place] FDA and has been authorized for detection and/or diagnosis of SARS-CoV-2 by FDA under an Emergency Use Authorization (EUA). This EUA will remain in effect (meaning this test can be used) for the duration of the COVID-19 declaration under Section 564(b)(1) of the Act, 21 U.S.C. section 360bbb-3(b)(1), unless the authorization is terminated or revoked.  Performed at [facility], [address] [place], [place] [id number]      Radiology Studies: CT Abdomen Pelvis W Contrast  Result Date: 04/16/2021 CLINICAL DATA:  Generalized abdominal pain. Nausea and vomiting. Bloody diarrhea. Elevated white blood cell count. EXAM: CT ABDOMEN AND PELVIS WITH CONTRAST TECHNIQUE: Multidetector CT imaging of the abdomen and pelvis was performed using the standard protocol following bolus administration of intravenous contrast. CONTRAST:  80mL OMNIPAQUE IOHEXOL 350 MG/ML SOLN COMPARISON:  None. FINDINGS: Lower chest: No acute airspace disease or pleural effusion. Heart is normal in size. Hepatobiliary: Borderline hepatic steatosis. No focal hepatic lesion. Questionable  layering sludge in the gallbladder. No calcified gallstone. No pericholecystic inflammation. No biliary dilatation. Pancreas: No ductal dilatation or inflammation. Spleen: Upper normal in size spanning 14.2 cm cranial caudal. No focal abnormality. Adrenals/Urinary Tract: Low-density left adrenal nodule measuring 11 mm transverse dimension most consistent with adenoma. Normal right adrenal gland. No hydronephrosis or perinephric edema. Homogeneous renal enhancement with symmetric excretion on delayed phase imaging. No visualized renal calculi. Subcentimeter low-density in the posterior lower left kidney is likely cyst but too small to characterize. Urinary bladder is physiologically distended without wall thickening. Stomach/Bowel: Colonic wall thickening with pericolonic edema extending from the splenic flexure of the colon through the mid sigmoid. There is liquid stool in the left colon. No pneumatosis. Moderate colonic stool burden in the  ascending and transverse colon. There is no obvious mass lesion at transition from stool filled to decompressed transverse colon. Normal appendix. Few fluid-filled loops of small bowel in the left abdomen likely reactive. No small bowel obstruction. Unremarkable stomach. Vascular/Lymphatic: Normal caliber abdominal aorta. Patent portal vein. No portal venous or mesenteric gas. Patent portal vein. Few small central mesenteric lymph nodes are not enlarged by size criteria and typically reactive. No enlarged lymph nodes in the abdomen or pelvis. Reproductive: Hysterectomy.  No adnexal mass. Other: No perforation or abscess. No ascites or free fluid. Trace thickening of the left pericolic gutter is reactive. Minimal fat in the left inguinal canal. Musculoskeletal: L5-S1 degenerative disc disease. There are no acute or suspicious osseous abnormalities. IMPRESSION: 1. Colitis extending from the splenic flexure of the colon through the mid sigmoid colon, likely infectious or  inflammatory. No perforation or abscess. 2. Moderate stool in the right colon. While there is no obvious colonic mass at the transition from stool filled decompressed colon, up-to-date colonoscopy is recommended 3. Borderline hepatic steatosis. 4. Left adrenal adenoma. Electronically Signed   By: Narda Rutherford M.D.   On: 04/16/2021 01:37    Scheduled Meds:  FLUoxetine  20 mg Oral Daily   insulin aspart  0-9 Units Subcutaneous Q4H   insulin glargine-yfgn  15 Units Subcutaneous QHS   mometasone-formoterol  2 puff Inhalation BID   pantoprazole  40 mg Oral Daily   Continuous Infusions:  ciprofloxacin 400 mg (04/17/21 0257)   metronidazole 500 mg (04/17/21 0536)     LOS: 1 day   Time spent:  [name], DO [facility]  If 7PM-7AM, please contact night-coverage [email or web address]  04/17/2021, 8:25 AM

## 2021-04-18 LAB — GLUCOSE, CAPILLARY
Glucose-Capillary: 218 mg/dL — ABNORMAL HIGH (ref 70–99)
Glucose-Capillary: 125 mg/dL — ABNORMAL HIGH (ref 70–99)
Glucose-Capillary: 228 mg/dL — ABNORMAL HIGH (ref 70–99)

## 2021-04-18 LAB — BASIC METABOLIC PANEL
Anion gap: 7 (ref 5–15)
BUN: 12 mg/dL (ref 6–20)
CO2: 24 mmol/L (ref 22–32)
Calcium: 9 mg/dL (ref 8.9–10.3)
Chloride: 110 mmol/L (ref 98–111)
Creatinine, Ser: 0.71 mg/dL (ref 0.44–1.00)
GFR, Estimated: >60 mL/min (ref >60)
Glucose, Bld: 207 mg/dL — ABNORMAL HIGH (ref 70–99)
Potassium: 4 mmol/L (ref 3.5–5.1)
Sodium: 141 mmol/L (ref 135–145)

## 2021-04-18 LAB — CBC
HCT: 32 % — ABNORMAL LOW (ref 36.0–46.0)
Hemoglobin: 10.1 g/dL — ABNORMAL LOW (ref 12.0–15.0)
MCH: 26.4 pg (ref 26.0–34.0)
MCHC: 31.6 g/dL (ref 30.0–36.0)
MCV: 83.8 fL (ref 80.0–100.0)
Platelets: 278 10*3/uL (ref 150–400)
RBC: 3.82 MIL/uL — ABNORMAL LOW (ref 3.87–5.11)
RDW: 13.8 % (ref 11.5–15.5)
WBC: 9.2 10*3/uL (ref 4.0–10.5)
nRBC: 0 % (ref 0.0–0.2)

## 2021-04-18 MED ORDER — HYDROCODONE-ACETAMINOPHEN 5-325 MG PO TABS: 1.0000 | ORAL_TABLET | Freq: Four times a day (QID) | ORAL | 0 refills | Status: AC | PRN

## 2021-04-18 MED ORDER — CIPROFLOXACIN HCL 500 MG PO TABS: 500.0000 mg | ORAL_TABLET | Freq: Two times a day (BID) | ORAL | 0 refills | Status: AC

## 2021-04-18 MED ORDER — DICYCLOMINE HCL 10 MG PO CAPS: 10.0000 mg | ORAL_CAPSULE | Freq: Three times a day (TID) | ORAL | 0 refills | Status: AC

## 2021-04-18 NOTE — Progress Notes (Signed)
PHARMACY NOTE -  ciprofloxacin  Pharmacy has been assisting with dosing of ciprofloxacin for infectious colitis. Dosage remains stable at 400 mg IV q12h and need for further dosage adjustment appears unlikely at present.    Will sign off at this time.  Please reconsult if a change in clinical status warrants re-evaluation of dosage.  Dorna Leitz, PharmD, BCPS 04/18/2021 7:51 AM

## 2021-04-18 NOTE — Progress Notes (Signed)
Pt discharged home. AVS printed and educational teaching completed with teach back method. "Ask Me 3" utilized. PIV removed (clean, dry, intact). Pt has all belongings. Pt transported escorted to lobby by staff. No further questions at this time.

## 2021-05-16 ENCOUNTER — Encounter (HOSPITAL_COMMUNITY): Payer: Self-pay | Admitting: Radiology

## 2021-10-18 ENCOUNTER — Other Ambulatory Visit: Payer: Self-pay

## 2021-10-18 ENCOUNTER — Inpatient Hospital Stay (HOSPITAL_COMMUNITY): Payer: Medicare PPO | Admitting: Anesthesiology

## 2021-10-18 ENCOUNTER — Inpatient Hospital Stay (HOSPITAL_COMMUNITY)
Admission: EM | Admit: 2021-10-18 | Discharge: 2021-10-20 | DRG: 481 | Disposition: A | Discharge status: Home Health | Payer: Medicare PPO | Attending: Family Medicine | Admitting: Family Medicine

## 2021-10-18 ENCOUNTER — Inpatient Hospital Stay (HOSPITAL_COMMUNITY): Payer: Medicare PPO

## 2021-10-18 ENCOUNTER — Encounter (HOSPITAL_COMMUNITY)
Admission: EM | Disposition: A | Discharge status: Home Health | Payer: Self-pay | Source: Home / Self Care | Attending: Family Medicine

## 2021-10-18 ENCOUNTER — Emergency Department (HOSPITAL_COMMUNITY): Payer: Medicare PPO

## 2021-10-18 ENCOUNTER — Encounter (HOSPITAL_COMMUNITY): Payer: Self-pay | Admitting: Student

## 2021-10-18 DIAGNOSIS — S72001A Fracture of unspecified part of neck of right femur, initial encounter for closed fracture: Secondary | ICD-10-CM

## 2021-10-18 DIAGNOSIS — E119 Type 2 diabetes mellitus without complications: Secondary | ICD-10-CM

## 2021-10-18 DIAGNOSIS — Y92009 Unspecified place in unspecified non-institutional (private) residence as the place of occurrence of the external cause: Secondary | ICD-10-CM

## 2021-10-18 DIAGNOSIS — W19XXXA Unspecified fall, initial encounter: Secondary | ICD-10-CM | POA: Diagnosis not present

## 2021-10-18 DIAGNOSIS — K219 Gastro-esophageal reflux disease without esophagitis: Secondary | ICD-10-CM | POA: Diagnosis present

## 2021-10-18 DIAGNOSIS — Z90710 Acquired absence of both cervix and uterus: Secondary | ICD-10-CM

## 2021-10-18 DIAGNOSIS — E785 Hyperlipidemia, unspecified: Secondary | ICD-10-CM | POA: Diagnosis present

## 2021-10-18 DIAGNOSIS — G43909 Migraine, unspecified, not intractable, without status migrainosus: Secondary | ICD-10-CM | POA: Diagnosis present

## 2021-10-18 DIAGNOSIS — Z794 Long term (current) use of insulin: Secondary | ICD-10-CM | POA: Diagnosis not present

## 2021-10-18 DIAGNOSIS — S72011A Unspecified intracapsular fracture of right femur, initial encounter for closed fracture: Principal | ICD-10-CM | POA: Diagnosis present

## 2021-10-18 DIAGNOSIS — I10 Essential (primary) hypertension: Secondary | ICD-10-CM | POA: Diagnosis present

## 2021-10-18 DIAGNOSIS — G4730 Sleep apnea, unspecified: Secondary | ICD-10-CM | POA: Diagnosis present

## 2021-10-18 DIAGNOSIS — M797 Fibromyalgia: Secondary | ICD-10-CM | POA: Diagnosis present

## 2021-10-18 DIAGNOSIS — Z7951 Long term (current) use of inhaled steroids: Secondary | ICD-10-CM

## 2021-10-18 DIAGNOSIS — E1140 Type 2 diabetes mellitus with diabetic neuropathy, unspecified: Secondary | ICD-10-CM | POA: Diagnosis present

## 2021-10-18 DIAGNOSIS — T148XXA Other injury of unspecified body region, initial encounter: Secondary | ICD-10-CM

## 2021-10-18 DIAGNOSIS — E1165 Type 2 diabetes mellitus with hyperglycemia: Secondary | ICD-10-CM | POA: Diagnosis present

## 2021-10-18 DIAGNOSIS — K589 Irritable bowel syndrome without diarrhea: Secondary | ICD-10-CM | POA: Diagnosis present

## 2021-10-18 DIAGNOSIS — E669 Obesity, unspecified: Secondary | ICD-10-CM | POA: Diagnosis present

## 2021-10-18 DIAGNOSIS — W1809XA Striking against other object with subsequent fall, initial encounter: Secondary | ICD-10-CM | POA: Diagnosis present

## 2021-10-18 DIAGNOSIS — Z6833 Body mass index (BMI) 33.0-33.9, adult: Secondary | ICD-10-CM

## 2021-10-18 DIAGNOSIS — D62 Acute posthemorrhagic anemia: Secondary | ICD-10-CM | POA: Diagnosis not present

## 2021-10-18 DIAGNOSIS — Z9181 History of falling: Secondary | ICD-10-CM

## 2021-10-18 DIAGNOSIS — Z888 Allergy status to other drugs, medicaments and biological substances status: Secondary | ICD-10-CM

## 2021-10-18 DIAGNOSIS — Z87891 Personal history of nicotine dependence: Secondary | ICD-10-CM

## 2021-10-18 DIAGNOSIS — S7290XA Unspecified fracture of unspecified femur, initial encounter for closed fracture: Secondary | ICD-10-CM

## 2021-10-18 DIAGNOSIS — F329 Major depressive disorder, single episode, unspecified: Secondary | ICD-10-CM | POA: Diagnosis present

## 2021-10-18 DIAGNOSIS — Z20822 Contact with and (suspected) exposure to covid-19: Secondary | ICD-10-CM | POA: Diagnosis present

## 2021-10-18 DIAGNOSIS — G4700 Insomnia, unspecified: Secondary | ICD-10-CM | POA: Diagnosis present

## 2021-10-18 DIAGNOSIS — Z7984 Long term (current) use of oral hypoglycemic drugs: Secondary | ICD-10-CM

## 2021-10-18 DIAGNOSIS — M47816 Spondylosis without myelopathy or radiculopathy, lumbar region: Secondary | ICD-10-CM | POA: Diagnosis present

## 2021-10-18 DIAGNOSIS — J449 Chronic obstructive pulmonary disease, unspecified: Secondary | ICD-10-CM | POA: Diagnosis present

## 2021-10-18 DIAGNOSIS — Z79899 Other long term (current) drug therapy: Secondary | ICD-10-CM

## 2021-10-18 HISTORY — DX: Fibromyalgia: M79.7

## 2021-10-18 HISTORY — DX: Sleep apnea, unspecified: G47.30

## 2021-10-18 HISTORY — DX: Essential (primary) hypertension: I10

## 2021-10-18 LAB — CBC WITH DIFFERENTIAL/PLATELET
Abs Immature Granulocytes: 0.16 10*3/uL — ABNORMAL HIGH (ref 0.00–0.07)
Basophils Absolute: 0.1 10*3/uL (ref 0.0–0.1)
Basophils Relative: 1 %
Eosinophils Absolute: 0.2 10*3/uL (ref 0.0–0.5)
Eosinophils Relative: 2 %
HCT: 39.2 % (ref 36.0–46.0)
Hemoglobin: 12.5 g/dL (ref 12.0–15.0)
Immature Granulocytes: 1 %
Lymphocytes Relative: 19 %
Lymphs Abs: 2.3 10*3/uL (ref 0.7–4.0)
MCH: 26.4 pg (ref 26.0–34.0)
MCHC: 31.9 g/dL (ref 30.0–36.0)
MCV: 82.7 fL (ref 80.0–100.0)
Monocytes Absolute: 0.8 10*3/uL (ref 0.1–1.0)
Monocytes Relative: 7 %
Neutro Abs: 8.7 10*3/uL — ABNORMAL HIGH (ref 1.7–7.7)
Neutrophils Relative %: 70 %
Platelets: 258 10*3/uL (ref 150–400)
RBC: 4.74 MIL/uL (ref 3.87–5.11)
RDW: 13.8 % (ref 11.5–15.5)
WBC: 12.4 10*3/uL — ABNORMAL HIGH (ref 4.0–10.5)
nRBC: 0 % (ref 0.0–0.2)

## 2021-10-18 LAB — SURGICAL PCR SCREEN
MRSA, PCR: NEGATIVE
Staphylococcus aureus: POSITIVE — AB

## 2021-10-18 LAB — TYPE AND SCREEN
ABO/RH(D): O POS
Antibody Screen: NEGATIVE

## 2021-10-18 LAB — PROTIME-INR
INR: 1.1 (ref 0.8–1.2)
Prothrombin Time: 13.8 seconds (ref 11.4–15.2)

## 2021-10-18 LAB — BASIC METABOLIC PANEL
Anion gap: 13 (ref 5–15)
BUN: 13 mg/dL (ref 6–20)
CO2: 17 mmol/L — ABNORMAL LOW (ref 22–32)
Calcium: 9.2 mg/dL (ref 8.9–10.3)
Chloride: 103 mmol/L (ref 98–111)
Creatinine, Ser: 0.72 mg/dL (ref 0.44–1.00)
GFR, Estimated: >60 mL/min (ref >60)
Glucose, Bld: 332 mg/dL — ABNORMAL HIGH (ref 70–99)
Potassium: 3.7 mmol/L (ref 3.5–5.1)
Sodium: 133 mmol/L — ABNORMAL LOW (ref 135–145)

## 2021-10-18 LAB — I-STAT VENOUS BLOOD GAS, ED
Acid-base deficit: 6 mmol/L — ABNORMAL HIGH (ref 0.0–2.0)
Bicarbonate: 17.1 mmol/L — ABNORMAL LOW (ref 20.0–28.0)
Calcium, Ion: 1.09 mmol/L — ABNORMAL LOW (ref 1.15–1.40)
HCT: 35 % — ABNORMAL LOW (ref 36.0–46.0)
Hemoglobin: 11.9 g/dL — ABNORMAL LOW (ref 12.0–15.0)
O2 Saturation: 71 %
Potassium: 3.5 mmol/L (ref 3.5–5.1)
Sodium: 139 mmol/L (ref 135–145)
TCO2: 18 mmol/L — ABNORMAL LOW (ref 22–32)
pCO2, Ven: 25 mmHg — ABNORMAL LOW (ref 44–60)
pH, Ven: 7.443 — ABNORMAL HIGH (ref 7.25–7.43)
pO2, Ven: 35 mmHg (ref 32–45)

## 2021-10-18 LAB — BETA-HYDROXYBUTYRIC ACID: Beta-Hydroxybutyric Acid: 0.15 mmol/L (ref 0.05–0.27)

## 2021-10-18 LAB — GLUCOSE, CAPILLARY
Glucose-Capillary: 195 mg/dL — ABNORMAL HIGH (ref 70–99)
Glucose-Capillary: 175 mg/dL — ABNORMAL HIGH (ref 70–99)
Glucose-Capillary: 291 mg/dL — ABNORMAL HIGH (ref 70–99)

## 2021-10-18 LAB — RESP PANEL BY RT-PCR (FLU A&B, COVID) ARPGX2
Influenza A by PCR: NEGATIVE
Influenza B by PCR: NEGATIVE
SARS Coronavirus 2 by RT PCR: NEGATIVE

## 2021-10-18 SURGERY — FIXATION, FEMUR, NECK, PERCUTANEOUS, USING SCREW
Anesthesia: General | Site: Hip | Laterality: Right

## 2021-10-18 MED ORDER — MORPHINE SULFATE (PF) 4 MG/ML IV SOLN
4.0000 mg | INTRAVENOUS | Status: AC | PRN
  Administered 2021-10-19 – 2021-10-20 (×2): 4 mg via INTRAVENOUS
  Filled 2021-10-18 (×2): qty 1

## 2021-10-18 MED ORDER — ACETAMINOPHEN 500 MG PO TABS: 1000.0000 mg | ORAL_TABLET | Freq: Once | ORAL | Status: AC

## 2021-10-18 MED ORDER — HYDROMORPHONE HCL 1 MG/ML IJ SOLN
0.2500 mg | INTRAMUSCULAR | Status: DC | PRN
  Administered 2021-10-18 (×3): 0.25 mg via INTRAVENOUS
  Administered 2021-10-18: 0.5 mg via INTRAVENOUS

## 2021-10-18 MED ORDER — METHOCARBAMOL 1000 MG/10ML IJ SOLN
500.0000 mg | Freq: Once | INTRAVENOUS | Status: AC
  Administered 2021-10-18: 500 mg via INTRAVENOUS
  Filled 2021-10-18: qty 5

## 2021-10-18 MED ORDER — ALBUTEROL SULFATE HFA 108 (90 BASE) MCG/ACT IN AERS
2.0000 | INHALATION_SPRAY | RESPIRATORY_TRACT | Status: DC | PRN
  Administered 2021-10-19 (×2): 2 via RESPIRATORY_TRACT
  Filled 2021-10-18: qty 6.7

## 2021-10-18 MED ORDER — ONDANSETRON HCL 4 MG/2ML IJ SOLN
INTRAMUSCULAR | Status: DC | PRN
  Administered 2021-10-18: 4 mg via INTRAVENOUS

## 2021-10-18 MED ORDER — KETOROLAC TROMETHAMINE 30 MG/ML IJ SOLN
30.0000 mg | Freq: Once | INTRAMUSCULAR | Status: AC
  Administered 2021-10-18: 30 mg via INTRAVENOUS

## 2021-10-18 MED ORDER — SODIUM CHLORIDE 0.9 % IV BOLUS
1000.0000 mL | Freq: Once | INTRAVENOUS | Status: AC
  Administered 2021-10-18: 1000 mL via INTRAVENOUS

## 2021-10-18 MED ORDER — INSULIN ASPART 100 UNIT/ML IJ SOLN: 0.0000 [IU] | INTRAMUSCULAR | Status: DC | PRN

## 2021-10-18 MED ORDER — INSULIN ASPART 100 UNIT/ML IJ SOLN
INTRAMUSCULAR | Status: AC
  Administered 2021-10-18: 4 [IU] via SUBCUTANEOUS
  Filled 2021-10-18: qty 1

## 2021-10-18 MED ORDER — SUGAMMADEX SODIUM 200 MG/2ML IV SOLN
INTRAVENOUS | Status: DC | PRN
  Administered 2021-10-18: 200 mg via INTRAVENOUS

## 2021-10-18 MED ORDER — MIDAZOLAM HCL 2 MG/2ML IJ SOLN
INTRAMUSCULAR | Status: AC
  Filled 2021-10-18: qty 2

## 2021-10-18 MED ORDER — LACTATED RINGERS IV SOLN: INTRAVENOUS | Status: DC | PRN

## 2021-10-18 MED ORDER — CHLORHEXIDINE GLUCONATE 0.12 % MT SOLN
OROMUCOSAL | Status: AC
  Administered 2021-10-18: 15 mL via OROMUCOSAL
  Filled 2021-10-18: qty 15

## 2021-10-18 MED ORDER — FENTANYL CITRATE (PF) 100 MCG/2ML IJ SOLN
INTRAMUSCULAR | Status: DC | PRN
Start: 2021-10-18 — End: 2021-10-18
  Administered 2021-10-18 (×2): 50 ug via INTRAVENOUS

## 2021-10-18 MED ORDER — LACTATED RINGERS IV SOLN: INTRAVENOUS | Status: DC

## 2021-10-18 MED ORDER — CEFAZOLIN SODIUM-DEXTROSE 2-4 GM/100ML-% IV SOLN
2.0000 g | Freq: Three times a day (TID) | INTRAVENOUS | Status: AC
Start: 2021-10-18 — End: 2021-10-19
  Administered 2021-10-18 – 2021-10-19 (×2): 2 g via INTRAVENOUS
  Filled 2021-10-18 (×2): qty 100

## 2021-10-18 MED ORDER — ORAL CARE MOUTH RINSE: 15.0000 mL | Freq: Once | OROMUCOSAL | Status: AC

## 2021-10-18 MED ORDER — MOMETASONE FURO-FORMOTEROL FUM 200-5 MCG/ACT IN AERO
2.0000 | INHALATION_SPRAY | Freq: Two times a day (BID) | RESPIRATORY_TRACT | Status: DC
  Administered 2021-10-19 – 2021-10-20 (×3): 2 via RESPIRATORY_TRACT
  Filled 2021-10-18 (×2): qty 8.8

## 2021-10-18 MED ORDER — ATENOLOL 50 MG PO TABS
25.0000 mg | ORAL_TABLET | Freq: Once | ORAL | Status: AC
  Administered 2021-10-18: 25 mg via ORAL
  Filled 2021-10-18: qty 0.5

## 2021-10-18 MED ORDER — MIDAZOLAM HCL 5 MG/5ML IJ SOLN
INTRAMUSCULAR | Status: DC | PRN
  Administered 2021-10-18 (×2): 1 mg via INTRAVENOUS

## 2021-10-18 MED ORDER — CEFAZOLIN SODIUM-DEXTROSE 2-4 GM/100ML-% IV SOLN
2.0000 g | INTRAVENOUS | Status: AC
  Administered 2021-10-18: 2 g via INTRAVENOUS

## 2021-10-18 MED ORDER — ASPIRIN 325 MG PO TABS
325.0000 mg | ORAL_TABLET | Freq: Every day | ORAL | Status: DC
Start: 2021-10-18 — End: 2021-10-19
  Administered 2021-10-18 – 2021-10-19 (×2): 325 mg via ORAL
  Filled 2021-10-18 (×2): qty 1

## 2021-10-18 MED ORDER — ONDANSETRON HCL 4 MG/2ML IJ SOLN
4.0000 mg | Freq: Once | INTRAMUSCULAR | Status: AC
  Administered 2021-10-18: 4 mg via INTRAVENOUS
  Filled 2021-10-18: qty 2

## 2021-10-18 MED ORDER — DEXAMETHASONE SODIUM PHOSPHATE 4 MG/ML IJ SOLN
INTRAMUSCULAR | Status: DC | PRN
  Administered 2021-10-18: 5 mg via INTRAVENOUS

## 2021-10-18 MED ORDER — CHLORHEXIDINE GLUCONATE 4 % EX LIQD: 60.0000 mL | Freq: Once | CUTANEOUS | Status: DC

## 2021-10-18 MED ORDER — HYDROMORPHONE HCL 1 MG/ML IJ SOLN
INTRAMUSCULAR | Status: AC
  Administered 2021-10-18: 0.5 mg via INTRAVENOUS
  Filled 2021-10-18: qty 1

## 2021-10-18 MED ORDER — TRANEXAMIC ACID-NACL 1000-0.7 MG/100ML-% IV SOLN
1000.0000 mg | INTRAVENOUS | Status: AC
  Administered 2021-10-18: 1000 mg via INTRAVENOUS

## 2021-10-18 MED ORDER — INSULIN ASPART 100 UNIT/ML IJ SOLN
0.0000 [IU] | INTRAMUSCULAR | Status: DC
  Administered 2021-10-18: 5 [IU] via SUBCUTANEOUS
  Administered 2021-10-19: 7 [IU] via SUBCUTANEOUS

## 2021-10-18 MED ORDER — PROPOFOL 10 MG/ML IV BOLUS
INTRAVENOUS | Status: DC | PRN
  Administered 2021-10-18: 120 mg via INTRAVENOUS

## 2021-10-18 MED ORDER — ROCURONIUM BROMIDE 100 MG/10ML IV SOLN
INTRAVENOUS | Status: DC | PRN
  Administered 2021-10-18: 50 mg via INTRAVENOUS

## 2021-10-18 MED ORDER — INSULIN GLARGINE-YFGN 100 UNIT/ML ~~LOC~~ SOLN: 20.0000 [IU] | Freq: Every day | SUBCUTANEOUS | Status: DC

## 2021-10-18 MED ORDER — PROPOFOL 10 MG/ML IV BOLUS
INTRAVENOUS | Status: AC
  Filled 2021-10-18: qty 20

## 2021-10-18 MED ORDER — HYDROMORPHONE HCL 1 MG/ML IJ SOLN
INTRAMUSCULAR | Status: AC
  Filled 2021-10-18: qty 1

## 2021-10-18 MED ORDER — MORPHINE SULFATE (PF) 2 MG/ML IV SOLN
2.0000 mg | INTRAVENOUS | Status: DC | PRN
  Administered 2021-10-18 – 2021-10-20 (×7): 2 mg via INTRAVENOUS
  Filled 2021-10-18 (×7): qty 1

## 2021-10-18 MED ORDER — CHLORHEXIDINE GLUCONATE 0.12 % MT SOLN: 15.0000 mL | Freq: Once | OROMUCOSAL | Status: AC

## 2021-10-18 MED ORDER — 0.9 % SODIUM CHLORIDE (POUR BTL) OPTIME
TOPICAL | Status: DC | PRN
  Administered 2021-10-18: 1000 mL

## 2021-10-18 MED ORDER — SODIUM CHLORIDE 0.9 % IV SOLN: INTRAVENOUS | Status: DC

## 2021-10-18 MED ORDER — KETOROLAC TROMETHAMINE 30 MG/ML IJ SOLN
INTRAMUSCULAR | Status: AC
  Administered 2021-10-18: 30 mg
  Filled 2021-10-18: qty 1

## 2021-10-18 MED ORDER — PHENYLEPHRINE HCL (PRESSORS) 10 MG/ML IV SOLN
INTRAVENOUS | Status: DC | PRN
  Administered 2021-10-18 (×2): 80 ug via INTRAVENOUS

## 2021-10-18 MED ORDER — ACETAMINOPHEN 500 MG PO TABS
ORAL_TABLET | ORAL | Status: AC
  Administered 2021-10-18: 1000 mg via ORAL
  Filled 2021-10-18: qty 2

## 2021-10-18 MED ORDER — TRANEXAMIC ACID-NACL 1000-0.7 MG/100ML-% IV SOLN
INTRAVENOUS | Status: AC
  Filled 2021-10-18: qty 100

## 2021-10-18 MED ORDER — MORPHINE SULFATE (PF) 4 MG/ML IV SOLN
4.0000 mg | INTRAVENOUS | Status: AC | PRN
  Administered 2021-10-18 (×2): 4 mg via INTRAVENOUS
  Filled 2021-10-18 (×2): qty 1

## 2021-10-18 MED ORDER — LIDOCAINE HCL (CARDIAC) PF 50 MG/5ML IV SOSY
PREFILLED_SYRINGE | INTRAVENOUS | Status: DC | PRN
  Administered 2021-10-18: 60 mg via INTRAVENOUS

## 2021-10-18 MED ORDER — FENTANYL CITRATE (PF) 250 MCG/5ML IJ SOLN
INTRAMUSCULAR | Status: AC
  Filled 2021-10-18: qty 5

## 2021-10-18 MED ORDER — CEFAZOLIN SODIUM-DEXTROSE 2-4 GM/100ML-% IV SOLN
INTRAVENOUS | Status: AC
  Filled 2021-10-18: qty 100

## 2021-10-18 SURGICAL SUPPLY — 33 items
BAG COUNTER SPONGE SURGICOUNT (BAG) ×2 IMPLANT
BIT DRILL CANNULATED (DRILL) IMPLANT
COVER PERINEAL POST (MISCELLANEOUS) ×2 IMPLANT
COVER SURGICAL LIGHT HANDLE (MISCELLANEOUS) ×4 IMPLANT
DRAPE C-ARMOR (DRAPES) ×1 IMPLANT
DRAPE STERI IOBAN 125X83 (DRAPES) ×2 IMPLANT
DRILL CANNULATED (DRILL) ×2
DRSG ADAPTIC 3X8 NADH LF (GAUZE/BANDAGES/DRESSINGS) ×2 IMPLANT
DRSG AQUACEL AG ADV 3.5X 6 (GAUZE/BANDAGES/DRESSINGS) ×1 IMPLANT
DRSG MEPILEX BORDER 4X4 (GAUZE/BANDAGES/DRESSINGS) ×2 IMPLANT
DURAPREP 26ML APPLICATOR (WOUND CARE) ×2 IMPLANT
ELECT REM PT RETURN 9FT ADLT (ELECTROSURGICAL)
ELECTRODE REM PT RTRN 9FT ADLT (ELECTROSURGICAL) IMPLANT
GLOVE SURG ORTHO LTX SZ9 (GLOVE) ×2 IMPLANT
GLOVE SURG UNDER POLY LF SZ9 (GLOVE) ×2 IMPLANT
GOWN STRL REUS W/ TWL XL LVL3 (GOWN DISPOSABLE) ×3 IMPLANT
GOWN STRL REUS W/TWL XL LVL3 (GOWN DISPOSABLE) ×3
GUIDE PIN 3.2MM 5PK (PIN) ×2
KIT BASIN OR (CUSTOM PROCEDURE TRAY) ×2 IMPLANT
KIT TURNOVER KIT B (KITS) ×2 IMPLANT
MANIFOLD NEPTUNE II (INSTRUMENTS) ×2 IMPLANT
NS IRRIG 1000ML POUR BTL (IV SOLUTION) ×2 IMPLANT
PACK GENERAL/GYN (CUSTOM PROCEDURE TRAY) ×2 IMPLANT
PAD ARMBOARD 7.5X6 YLW CONV (MISCELLANEOUS) ×4 IMPLANT
PIN GUIDE 3.2MM 5PK (PIN) IMPLANT
SCREW CANN RVRS CUT FLT 85X16 (Screw) IMPLANT
SCREW CANNULATED 6.5X85MM (Screw) ×2 IMPLANT
SCREW CANNULATED 6.5X95 (Screw) ×1 IMPLANT
STAPLER VISISTAT 35W (STAPLE) ×2 IMPLANT
SUT VIC AB 2-0 CTB1 (SUTURE) ×2 IMPLANT
TOWEL GREEN STERILE (TOWEL DISPOSABLE) ×2 IMPLANT
TOWEL GREEN STERILE FF (TOWEL DISPOSABLE) ×2 IMPLANT
WATER STERILE IRR 1000ML POUR (IV SOLUTION) ×2 IMPLANT

## 2021-10-18 NOTE — ED Notes (Signed)
Pt transported to xray 

## 2021-10-18 NOTE — ED Triage Notes (Signed)
Pt BIB [facility] from home d/t a fall.Pt EMS,  pt bumped into the wall bc pt is new to the hopuse and not used to having the wall in the house... Pt fell landed on her ride side, c/o 10/10 R hip pain. No apparent deformity noted. No LOC. Not on blood thinner. A&O X4.  ? ?BP 172/98 ?HR 94 ? ? ?

## 2021-10-18 NOTE — Op Note (Signed)
? ?  Date of Surgery: 10/18/2021 ? ?INDICATIONS: Veronica Cortez is a 51 y.o.-year-old female with valgus impact femoral neck fracture.  The risk and benefits of the procedure with discussed in detail and documented in the pre-operative evaluation. ? ?PREOPERATIVE DIAGNOSIS: 1.  Impacted femoral neck fracture ? ?POSTOPERATIVE DIAGNOSIS: Same. ? ?PROCEDURE: 1.  Right hip cannulated screw placement ? ?SURGEON: Benancio Deeds MD ? ?ASSISTANT: Carollee Leitz ? ?ANESTHESIA:  general ? ?IV FLUIDS AND URINE: See anesthesia record. ? ?ANTIBIOTICS: Ancef 2 g ? ?ESTIMATED BLOOD LOSS: 10 mL. ? ?IMPLANTS:  ?Implant Name Type Inv. Item Serial No. Manufacturer Lot No. LRB No. Used Action  ?SCREW CANNULATED 6.5X95 - [id number] Screw SCREW CANNULATED 6.5X95  [employer](OR[employer])  Right 1 Implanted  ?SCREW CANNULATED 6.5X85MM - [id number] Screw SCREW CANNULATED 6.5X85MM  [employer](ORTH,TRAU,BIO,SG)  Right 2 Implanted  ? ? ?DRAINS: None ? ?CULTURES: None ? ?COMPLICATIONS: none ? ?DESCRIPTION OF PROCEDURE:  ?The patient was identified in the preoperative holding area.  The correct site was marked according universal protocol with nursing.  She was subsequently taken back to the operating room.  Anesthesia was induced.  She was prepped and draped in usual sterile fashion.  Again final timeout was performed.  A guidewire was used with fluoroscopy in order to localize the skin incision about the lateral aspect of the trochanter.  10 blade was used to incise through skin and IT band.  A Cobb was used to elevate the vastus lateralis off the bone.  A wire was then introduced and this was placed in the inferior central position under direct AP and lateral fluoroscopy.  An additional anterior superior and posterior superior wire were then placed again under direct visualization.  Once we are happy with the placement of the wires, the drill was used to drill the lateral cortex.  A measuring guide was used off the lateral cortex to  determine screw sizes.  The appropriately sized screw as noted above was placed in the correct positions.  These all had excellent purchase.  Guidewires were removed and dynamic fluoroscopy was used to confirm all screws to be in the bone.  The wound was thoroughly irrigated and closed in layers of 0 Vicryl 2-0 Vicryl and staples.  Aquacel dressing was applied.  All counts were correct at the end of the case.  She was awoken taken to the PACU without complication ? ? ?POSTOPERATIVE PLAN: She will be weightbearing as tolerated on the right lower extremity.  She will be seen by physical therapy.  I will see her back in 2 weeks for wound check and for staple removal ? ?Benancio Deeds, MD ?5:55 PM ? ? ? ?

## 2021-10-18 NOTE — ED Notes (Signed)
Pt transported to short stay 

## 2021-10-18 NOTE — Anesthesia Procedure Notes (Signed)
Procedure Name: Intubation ?Date/Time: 10/18/2021 5:09 PM ?Performed by: Edmonia Caprio, CRNA ?Pre-anesthesia Checklist: Patient identified, Emergency Drugs available, Timeout performed, Patient being monitored and Suction available ?Patient Re-evaluated:Patient Re-evaluated prior to induction ?Oxygen Delivery Method: Circle system utilized ?Preoxygenation: Pre-oxygenation with 100% oxygen ?Induction Type: IV induction ?Ventilation: Mask ventilation without difficulty ?Laryngoscope Size: Hyacinth Meeker and 2 ?Grade View: Grade II ?Tube type: Oral ?Tube size: 7.0 mm ?Number of attempts: 1 ?Airway Equipment and Method: Stylet ?Placement Confirmation: ETT inserted through vocal cords under direct vision, positive ETCO2 and breath sounds checked- equal and bilateral ?Secured at: 24 cm ?Tube secured with: Tape ?Dental Injury: Teeth and Oropharynx as per pre-operative assessment  ? ? ? ? ?

## 2021-10-18 NOTE — ED Notes (Signed)
Hospitalist at bedside 

## 2021-10-18 NOTE — Transfer of Care (Signed)
Immediate Anesthesia Transfer of Care Note ? ?Patient: Veronica Cortez ? ?Procedure(s) Performed: CANNULATED HIP PINNING (Right: Hip) ? ?Patient Location: PACU ? ?Anesthesia Type:General ? ?Level of Consciousness: awake, alert  and oriented ? ?Airway & Oxygen Therapy: Patient Spontanous Breathing and Patient connected to nasal cannula oxygen ? ?Post-op Assessment: Report given to RN and Post -op Vital signs reviewed and stable ? ?Post vital signs: Reviewed and stable ? ?Last Vitals:  ?Vitals Value Taken Time  ?BP    ?Temp    ?Pulse 78 10/18/21 1825  ?Resp 15 10/18/21 1825  ?SpO2 96 % 10/18/21 1825  ?Vitals shown include unvalidated device data. ? ?Last Pain:  ?Vitals:  ? 10/18/21 1624  ?TempSrc: Oral  ?PainSc: 10-Worst pain ever  ?   ? ?  ? ?Complications: No notable events documented. ?

## 2021-10-18 NOTE — Hospital Course (Addendum)
Patient is a 51 year old female presenting with right femoral neck fracture after a fall onto her right hip. ? ?Subcapital Right Femoral Neck fracture ?Patient received IV morphine as needed.  Orthopedics consulted and performed cannulated right hip pinning with 3 screws as surgical intervention. Pain was well controlled following the procedure. ? ?Type 2 DM ?Patient was initially started on sensitive SSI due to n.p.o. status.  Patient was started on long-acting insulin once she was placed back on diet. Patient will be discharged on home medication.  ? ?Remainder of patient's chronic conditions including migraines, depression, and hyperlipidemia were well controlled with resuming of home meds. ? ?Follow up PCP recommendations: ?-Assess right hip pain and mobility ? ? ?

## 2021-10-18 NOTE — Progress Notes (Signed)
Pacu RN Report to floor given ? ?Gave report to  [employer] Room: 5N02.  Discussed surgery, meds given in OR and Pacu, VS, IV fluids given, EBL, urine output, pain and other pertinent information. Also discussed if pt had any family or friends here or belongings with them.  ? ?Discussed Toradol, Diladid, Robaxin given in Pacu. Gave Dilaudid in small doses, pt desating as she was falling asleep on 3L [place].  ? ?Pt exits my care.   ?

## 2021-10-18 NOTE — Discharge Instructions (Addendum)
? ? ?   Discharge Instructions  ? ? ?Attending Surgeon: [name], MD ?Office Phone Number: [phone] ? ? ?Diagnosis and Procedures:   ? ?Surgeries Performed: ?Right hip screws fixation ? ?Discharge Plan:  ? ? ?Activity:  ?Weight bearing as tolerated right leg ? ? ? ? ? ? ? ?Thank you for letting us care for you during your stay. ? ?You were admitted to the [facility] Medicine Teaching Service.  ? ?You were admitted for right hip fracture. ? ?We found the following during your stay right femoral neck fracture.  ? ?Please follow up with your orthopedic surgeon in 2 weeks and PCP in 1 week.  ? ?If your symptoms worsen or return, please return to the hospital. ? ?Please let us know if you have questions about your stay at [facility].  ? ?Take care! ?-[facility] ?

## 2021-10-18 NOTE — H&P (View-Only) (Signed)
° °ORTHOPAEDIC CONSULTATION ° °REQUESTING PHYSICIAN: Eniola, Kehinde T, MD ° °Chief Complaint: Right valgus impacted femoral neck fracture ° °HPI: °Veronica Cortez is a 51 y.o. female who presents with a right femoral neck fracture after a fall after she ran into a wall.  She immediately had significant pain in the right hip and proceeded to the emergency room.  She does have a history of chronic migraines as well as type 2 diabetes for which her last A1c was in the sevens.  She does have known peripheral neuropathy.  She helps to take care of her mother.  She does not use any walker or ambulatory devices. ° °Past Medical History:  °Diagnosis Date  ° Asthma   ° Depression   ° Diabetes mellitus without complication (HCC)   ° Fibromyalgia   ° GERD (gastroesophageal reflux disease)   ° Hypertension   ° IBD (inflammatory bowel disease)   ° Migraine   ° Neuropathy   ° Sleep apnea   ° °Past Surgical History:  °Procedure Laterality Date  ° ABDOMINAL HYSTERECTOMY    ° BACK SURGERY    ° ENDOSCOPIC PLANTAR FASCIOTOMY    ° HAND SURGERY    ° TUBAL LIGATION    ° °Social History  ° °Socioeconomic History  ° Marital status: Single  °  Spouse name: Not on file  ° Number of children: Not on file  ° Years of education: Not on file  ° Highest education level: Not on file  °Occupational History  ° Not on file  °Tobacco Use  ° Smoking status: Former  °  Types: Cigarettes  °  Quit date: 2012  °  Years since quitting: 11.1  ° Smokeless tobacco: Never  °Vaping Use  ° Vaping Use: Never used  °Substance and Sexual Activity  ° Alcohol use: Yes  °  Alcohol/week: 2.0 standard drinks  °  Types: 2 Glasses of wine per week  ° Drug use: Never  ° Sexual activity: Not on file  °Other Topics Concern  ° Not on file  °Social History Narrative  ° Not on file  ° °Social Determinants of Health  ° °Financial Resource Strain: Not on file  °Food Insecurity: Not on file  °Transportation Needs: Not on file  °Physical Activity: Not on file  °Stress: Not on  file  °Social Connections: Not on file  ° °Family History  °Problem Relation Age of Onset  ° Breast cancer Mother   ° Kidney failure Father   ° °- negative except otherwise stated in the family history section °Allergies  °Allergen Reactions  ° Acetazolamide Other (See Comments)  °  unknown  ° Benadryl [Diphenhydramine] Other (See Comments)  °  unknown  ° Clarithromycin Other (See Comments)  °  unknown  ° Compazine [Prochlorperazine] Other (See Comments)  °  unknown  ° Fentanyl Other (See Comments)  °  unknown  ° Lamictal [Lamotrigine] Other (See Comments)  °  unknown  ° Other Other (See Comments)  °  Triptans and statins; unknown  ° Reglan [Metoclopramide] Other (See Comments)  °  unknown  ° Topiramate Other (See Comments)  °  unknown  ° °Prior to Admission medications   °Medication Sig Start Date End Date Taking? Authorizing Provider  °ADVAIR HFA 115-21 MCG/ACT inhaler Inhale 2 puffs into the lungs 2 (two) times daily. 03/20/21  Yes [provider]  °AJOVY 225 MG/1.5ML SOAJ Inject 1.5 mLs into the skin every 30 (thirty) days. 04/09/21  Yes [provider]  °  albuterol (VENTOLIN HFA) 108 (90 Base) MCG/ACT inhaler Inhale 2 puffs into the lungs every 4 (four) hours as needed for wheezing or shortness of breath. 03/17/21  Yes [provider]  amitriptyline (ELAVIL) 100 MG tablet Take 100 mg by mouth at bedtime. 04/08/21  Yes [provider]  aspirin-acetaminophen-caffeine (EXCEDRIN MIGRAINE) [phone] MG tablet Take 2 tablets by mouth every 6 (six) hours as needed for headache.   Yes [provider]  atenolol (TENORMIN) 25 MG tablet Take 25 mg by mouth daily. 04/08/21  Yes [provider]  dicyclomine (BENTYL) 10 MG capsule Take 1 capsule (10 mg total) by mouth 3 (three) times daily before meals. 04/18/21 10/18/21 Yes Little Ishikawa, MD  FLUoxetine (PROZAC) 40 MG capsule Take 40 mg by mouth daily. 04/08/21  Yes [provider]  Insulin Aspart FlexPen  (NOVOLOG) 100 UNIT/ML Inject 10-16 Units into the skin 3 (three) times daily. 04/11/21  Yes [provider]  JANUVIA 100 MG tablet Take 100 mg by mouth daily. 04/08/21  Yes [provider]  LANTUS SOLOSTAR 100 UNIT/ML Solostar Pen Inject 30 Units into the skin at bedtime. 03/18/21  Yes [provider]  Melatonin 10 MG TABS Take 10 mg by mouth at bedtime.   Yes [provider]  montelukast (SINGULAIR) 10 MG tablet Take 10 mg by mouth daily. 03/17/21  Yes [provider]  Omega-3 Fatty Acids (FISH OIL PO) Take 1 capsule by mouth daily.   Yes [provider]  omeprazole (PRILOSEC) 20 MG capsule Take 20 mg by mouth 2 (two) times daily. 04/08/21  Yes [provider]  Probiotic Product (PROBIOTIC PO) Take 1 capsule by mouth daily.   Yes [provider]  promethazine (PHENERGAN) 25 MG tablet Take 25 mg by mouth daily as needed for nausea/vomiting. 04/08/21  Yes [provider]  Riboflavin (B2 PO) Take 1 tablet by mouth daily.   Yes [provider]  rosuvastatin (CRESTOR) 10 MG tablet Take 10 mg by mouth at bedtime. 04/08/21  Yes [provider]  topiramate (TOPAMAX) 200 MG tablet Take 200 mg by mouth 2 (two) times daily. 03/18/21  Yes [provider]  UBRELVY 100 MG TABS Take 100 mg by mouth 2 (two) times daily as needed for migraine. 04/09/21  Yes [provider]  VITAMIN E PO Take 1 tablet by mouth daily.   Yes [provider]   DG Lumbar Spine Complete  Result Date: 10/18/2021 CLINICAL DATA:  Trauma, pain EXAM: LUMBAR SPINE - COMPLETE 4+ VIEW COMPARISON:  None. FINDINGS: No recent fracture is seen. Alignment of posterior margins of vertebral bodies is unremarkable. Degenerative changes are noted, particularly severe at L4-L5 and L5-S1 levels with disc space narrowing, bony spurs and facet hypertrophy. Large amount of stool is seen in the colon. IMPRESSION: No recent fracture is seen. Lumbar  spondylosis. Large stool burden in the colon. Electronically Signed   By: Elmer Picker M.D.   On: 10/18/2021 13:42   DG Knee Complete 4 Views Right  Result Date: 10/18/2021 CLINICAL DATA:  Trauma, pain EXAM: RIGHT KNEE - COMPLETE 4+ VIEW COMPARISON:  None FINDINGS: No fracture or dislocation is seen. There is no significant effusion in the suprapatellar bursa. There is patchy sclerosis in the proximal fibula, possibly old bone infarct. Small calcification at the attachment of quadriceps tendon to the patella may suggest calcific tendinosis. IMPRESSION: No recent fracture or dislocation is seen in the right knee. Electronically Signed   By: Royston Cowper  Rathinasamy M.D.   On: 10/18/2021 13:40   DG Hip Unilat With Pelvis 2-3 Views Right  Result Date: 10/18/2021 CLINICAL DATA:  Trauma, pain EXAM: DG HIP (WITH OR WITHOUT PELVIS) 2-3V RIGHT COMPARISON:  None. FINDINGS: Subcapital fracture is seen in the neck of right femur. There is no dislocation. IMPRESSION: Impacted fracture is seen in the subcapital portion of neck of right femur. Electronically Signed   By: Elmer Picker M.D.   On: 10/18/2021 13:38     Positive ROS: All other systems have been reviewed and were otherwise negative with the exception of those mentioned in the HPI and as above.  Physical Exam: General: No acute distress Cardiovascular: No pedal edema Respiratory: No cyanosis, no use of accessory musculature GI: No organomegaly, abdomen is soft and non-tender Skin: No lesions in the area of chief complaint Neurologic: Sensation intact distally Psychiatric: Patient is at baseline mood and affect Lymphatic: No axillary or cervical lymphadenopathy  MUSCULOSKELETAL:  Right hip with pain with gentle logroll.  She able to dorsiflex and plantarflex at the right foot.  2+ dorsalis pedis pulse.  Diminished sensation to light touch which is her known baseline  Independent Imaging Review: AP pelvis, 2 views right hip  x-ray: Valgus impacted femoral neck fracture  Assessment: 51 year old female with a valgus impacted femoral neck fracture after a fall while running into the wall.  Given the fact that this is valgus impacted I have advised that I would recommend cannulated screw placement in order to allow for earlier weightbearing and to protect the femoral neck while she does progress her weightbearing.  We discussed alternatives to surgery including a prolonged period of nonweightbearing which she does not wish to undertake as she is currently assisting taking care of her mother for which she moved to [place].  Plan: Plan for right hip 3 screws fixation   After a lengthy discussion of treatment options, including risks, benefits, alternatives, complications of surgical and nonsurgical conservative options, the patient elected surgical repair.   The patient  is aware of the material risks  and complications including, but not limited to injury to adjacent structures, neurovascular injury, infection, numbness, bleeding, implant failure, thermal burns, stiffness, persistent pain, failure to heal, disease transmission from allograft, need for further surgery, dislocation, anesthetic risks, blood clots, risks of death,and others. The probabilities of surgical success and failure discussed with patient given their particular co-morbidities.The time and nature of expected rehabilitation and recovery was discussed.The patient's questions were all answered preoperatively.  No barriers to understanding were noted. I explained the natural history of the disease process and Rx rationale.  I explained to the patient what I considered to be reasonable expectations given their personal situation.  The final treatment plan was arrived at through a shared patient decision making process model.   Thank you for the consult and the opportunity to see Ms. Lio  Vanetta Mulders, MD [facility] 4:32 PM

## 2021-10-18 NOTE — Anesthesia Preprocedure Evaluation (Addendum)
Anesthesia Evaluation  ?Patient identified by MRN, date of birth, ID band ?Patient awake ? ? ? ?Reviewed: ?Allergy & Precautions, H&P , NPO status , Patient's Chart, lab work & pertinent test results, reviewed documented beta blocker date and time  ? ?Airway ?Mallampati: II ? ?TM Distance: >3 FB ?Neck ROM: Full ? ? ? Dental ?no notable dental hx. ?(+) Teeth Intact, Dental Advisory Given ?  ?Pulmonary ?asthma , former smoker,  ?  ?Pulmonary exam normal ?breath sounds clear to auscultation ? ? ? ? ? ? Cardiovascular ?hypertension, Pt. on medications and Pt. on home beta blockers ? ?Rhythm:Regular Rate:Normal ? ? ?  ?Neuro/Psych ? Headaches, Depression   ? GI/Hepatic ?Neg liver ROS, GERD  Medicated,  ?Endo/Other  ?diabetes, Insulin Dependent ? Renal/GU ?negative Renal ROS  ?negative genitourinary ?  ?Musculoskeletal ? ? Abdominal ?  ?Peds ? Hematology ?negative hematology ROS ?(+)   ?Anesthesia Other Findings ? ? Reproductive/Obstetrics ?negative OB ROS ? ?  ? ? ? ? ? ? ? ? ? ? ? ? ? ?  ?  ? ? ? ? ? ? ? ?Anesthesia Physical ?Anesthesia Plan ? ?ASA: 3 ? ?Anesthesia Plan: General  ? ?Post-op Pain Management: Tylenol PO (pre-op)* and Toradol IV (intra-op)*  ? ?Induction: Intravenous ? ?PONV Risk Score and Plan: 4 or greater and Ondansetron, Dexamethasone and Midazolam ? ?Airway Management Planned: Oral ETT ? ?Additional Equipment:  ? ?Intra-op Plan:  ? ?Post-operative Plan: Extubation in OR ? ?Informed Consent: I have reviewed the patients History and Physical, chart, labs and discussed the procedure including the risks, benefits and alternatives for the proposed anesthesia with the patient or authorized representative who has indicated his/her understanding and acceptance.  ? ? ? ?Dental advisory given ? ?Plan Discussed with: CRNA ? ?Anesthesia Plan Comments:   ? ? ? ? ? ?Anesthesia Quick Evaluation ? ?

## 2021-10-18 NOTE — Anesthesia Postprocedure Evaluation (Signed)
Anesthesia Post Note ? ?Patient: Veronica Cortez ? ?Procedure(s) Performed: CANNULATED HIP PINNING (Right: Hip) ? ?  ? ?Patient location during evaluation: PACU ?Anesthesia Type: General ?Level of consciousness: awake and alert ?Pain management: pain level controlled ?Vital Signs Assessment: post-procedure vital signs reviewed and stable ?Respiratory status: spontaneous breathing, nonlabored ventilation, respiratory function stable and patient connected to nasal cannula oxygen ?Cardiovascular status: blood pressure returned to baseline and stable ?Postop Assessment: no apparent nausea or vomiting ?Anesthetic complications: no ? ? ?No notable events documented. ? ?Last Vitals:  ?Vitals:  ? 10/18/21 1945 10/18/21 2021  ?BP: 132/71 116/67  ?Pulse: 68 75  ?Resp: 16 18  ?Temp: 36.7 ?C 36.6 ?C  ?SpO2: 100% 96%  ?  ?Last Pain:  ?Vitals:  ? 10/18/21 2021  ?TempSrc: Oral  ?PainSc:   ? ? ?  ?  ?  ?  ?  ?  ? ?Lennox Leikam,W. EDMOND ? ? ? ? ?

## 2021-10-18 NOTE — Consult Note (Signed)
ORTHOPAEDIC CONSULTATION  REQUESTING PHYSICIAN: Doreene Eland, MD  Chief Complaint: Right valgus impacted femoral neck fracture  HPI: Veronica Cortez is a 51 y.o. female who presents with a right femoral neck fracture after a fall after she ran into a wall.  She immediately had significant pain in the right hip and proceeded to the emergency room.  She does have a history of chronic migraines as well as type 2 diabetes for which her last A1c was in the sevens.  She does have known peripheral neuropathy.  She helps to take care of her mother.  She does not use any walker or ambulatory devices.  Past Medical History:  Diagnosis Date   Asthma    Depression    Diabetes mellitus without complication (HCC)    Fibromyalgia    GERD (gastroesophageal reflux disease)    Hypertension    IBD (inflammatory bowel disease)    Migraine    Neuropathy    Sleep apnea    Past Surgical History:  Procedure Laterality Date   ABDOMINAL HYSTERECTOMY     BACK SURGERY     ENDOSCOPIC PLANTAR FASCIOTOMY     HAND SURGERY     TUBAL LIGATION     Social History   Socioeconomic History   Marital status: Single    Spouse name: Not on file   Number of children: Not on file   Years of education: Not on file   Highest education level: Not on file  Occupational History   Not on file  Tobacco Use   Smoking status: Former    Types: Cigarettes    Quit date: 2012    Years since quitting: 11.1   Smokeless tobacco: Never  Vaping Use   Vaping Use: Never used  Substance and Sexual Activity   Alcohol use: Yes    Alcohol/week: 2.0 standard drinks    Types: 2 Glasses of wine per week   Drug use: Never   Sexual activity: Not on file  Other Topics Concern   Not on file  Social History Narrative   Not on file   Social Determinants of Health   Financial Resource Strain: Not on file  Food Insecurity: Not on file  Transportation Needs: Not on file  Physical Activity: Not on file  Stress: Not on  file  Social Connections: Not on file   Family History  Problem Relation Age of Onset   Breast cancer Mother    Kidney failure Father    - negative except otherwise stated in the family history section Allergies  Allergen Reactions   Acetazolamide Other (See Comments)    unknown   Benadryl [Diphenhydramine] Other (See Comments)    unknown   Clarithromycin Other (See Comments)    unknown   Compazine [Prochlorperazine] Other (See Comments)    unknown   Fentanyl Other (See Comments)    unknown   Lamictal [Lamotrigine] Other (See Comments)    unknown   Other Other (See Comments)    Triptans and statins; unknown   Reglan [Metoclopramide] Other (See Comments)    unknown   Topiramate Other (See Comments)    unknown   Prior to Admission medications   Medication Sig Start Date End Date Taking? Authorizing Provider  ADVAIR HFA 115-21 MCG/ACT inhaler Inhale 2 puffs into the lungs 2 (two) times daily. 03/20/21  Yes [provider]  AJOVY 225 MG/1.5ML SOAJ Inject 1.5 mLs into the skin every 30 (thirty) days. 04/09/21  Yes [provider]  albuterol (VENTOLIN HFA) 108 (90 Base) MCG/ACT inhaler Inhale 2 puffs into the lungs every 4 (four) hours as needed for wheezing or shortness of breath. 03/17/21  Yes [provider]  amitriptyline (ELAVIL) 100 MG tablet Take 100 mg by mouth at bedtime. 04/08/21  Yes [provider]  aspirin-acetaminophen-caffeine (EXCEDRIN MIGRAINE) [phone] MG tablet Take 2 tablets by mouth every 6 (six) hours as needed for headache.   Yes [provider]  atenolol (TENORMIN) 25 MG tablet Take 25 mg by mouth daily. 04/08/21  Yes [provider]  dicyclomine (BENTYL) 10 MG capsule Take 1 capsule (10 mg total) by mouth 3 (three) times daily before meals. 04/18/21 10/18/21 Yes Little Ishikawa, MD  FLUoxetine (PROZAC) 40 MG capsule Take 40 mg by mouth daily. 04/08/21  Yes [provider]  Insulin Aspart FlexPen  (NOVOLOG) 100 UNIT/ML Inject 10-16 Units into the skin 3 (three) times daily. 04/11/21  Yes [provider]  JANUVIA 100 MG tablet Take 100 mg by mouth daily. 04/08/21  Yes [provider]  LANTUS SOLOSTAR 100 UNIT/ML Solostar Pen Inject 30 Units into the skin at bedtime. 03/18/21  Yes [provider]  Melatonin 10 MG TABS Take 10 mg by mouth at bedtime.   Yes [provider]  montelukast (SINGULAIR) 10 MG tablet Take 10 mg by mouth daily. 03/17/21  Yes [provider]  Omega-3 Fatty Acids (FISH OIL PO) Take 1 capsule by mouth daily.   Yes [provider]  omeprazole (PRILOSEC) 20 MG capsule Take 20 mg by mouth 2 (two) times daily. 04/08/21  Yes [provider]  Probiotic Product (PROBIOTIC PO) Take 1 capsule by mouth daily.   Yes [provider]  promethazine (PHENERGAN) 25 MG tablet Take 25 mg by mouth daily as needed for nausea/vomiting. 04/08/21  Yes [provider]  Riboflavin (B2 PO) Take 1 tablet by mouth daily.   Yes [provider]  rosuvastatin (CRESTOR) 10 MG tablet Take 10 mg by mouth at bedtime. 04/08/21  Yes [provider]  topiramate (TOPAMAX) 200 MG tablet Take 200 mg by mouth 2 (two) times daily. 03/18/21  Yes [provider]  UBRELVY 100 MG TABS Take 100 mg by mouth 2 (two) times daily as needed for migraine. 04/09/21  Yes [provider]  VITAMIN E PO Take 1 tablet by mouth daily.   Yes [provider]   DG Lumbar Spine Complete  Result Date: 10/18/2021 CLINICAL DATA:  Trauma, pain EXAM: LUMBAR SPINE - COMPLETE 4+ VIEW COMPARISON:  None. FINDINGS: No recent fracture is seen. Alignment of posterior margins of vertebral bodies is unremarkable. Degenerative changes are noted, particularly severe at L4-L5 and L5-S1 levels with disc space narrowing, bony spurs and facet hypertrophy. Large amount of stool is seen in the colon. IMPRESSION: No recent fracture is seen. Lumbar  spondylosis. Large stool burden in the colon. Electronically Signed   By: Elmer Picker M.D.   On: 10/18/2021 13:42   DG Knee Complete 4 Views Right  Result Date: 10/18/2021 CLINICAL DATA:  Trauma, pain EXAM: RIGHT KNEE - COMPLETE 4+ VIEW COMPARISON:  None FINDINGS: No fracture or dislocation is seen. There is no significant effusion in the suprapatellar bursa. There is patchy sclerosis in the proximal fibula, possibly old bone infarct. Small calcification at the attachment of quadriceps tendon to the patella may suggest calcific tendinosis. IMPRESSION: No recent fracture or dislocation is seen in the right knee. Electronically Signed   By: Royston Cowper  Rathinasamy M.D.   On: 10/18/2021 13:40   DG Hip Unilat With Pelvis 2-3 Views Right  Result Date: 10/18/2021 CLINICAL DATA:  Trauma, pain EXAM: DG HIP (WITH OR WITHOUT PELVIS) 2-3V RIGHT COMPARISON:  None. FINDINGS: Subcapital fracture is seen in the neck of right femur. There is no dislocation. IMPRESSION: Impacted fracture is seen in the subcapital portion of neck of right femur. Electronically Signed   By: Elmer Picker M.D.   On: 10/18/2021 13:38     Positive ROS: All other systems have been reviewed and were otherwise negative with the exception of those mentioned in the HPI and as above.  Physical Exam: General: No acute distress Cardiovascular: No pedal edema Respiratory: No cyanosis, no use of accessory musculature GI: No organomegaly, abdomen is soft and non-tender Skin: No lesions in the area of chief complaint Neurologic: Sensation intact distally Psychiatric: Patient is at baseline mood and affect Lymphatic: No axillary or cervical lymphadenopathy  MUSCULOSKELETAL:  Right hip with pain with gentle logroll.  She able to dorsiflex and plantarflex at the right foot.  2+ dorsalis pedis pulse.  Diminished sensation to light touch which is her known baseline  Independent Imaging Review: AP pelvis, 2 views right hip  x-ray: Valgus impacted femoral neck fracture  Assessment: 51 year old female with a valgus impacted femoral neck fracture after a fall while running into the wall.  Given the fact that this is valgus impacted I have advised that I would recommend cannulated screw placement in order to allow for earlier weightbearing and to protect the femoral neck while she does progress her weightbearing.  We discussed alternatives to surgery including a prolonged period of nonweightbearing which she does not wish to undertake as she is currently assisting taking care of her mother for which she moved to [place].  Plan: Plan for right hip 3 screws fixation   After a lengthy discussion of treatment options, including risks, benefits, alternatives, complications of surgical and nonsurgical conservative options, the patient elected surgical repair.   The patient  is aware of the material risks  and complications including, but not limited to injury to adjacent structures, neurovascular injury, infection, numbness, bleeding, implant failure, thermal burns, stiffness, persistent pain, failure to heal, disease transmission from allograft, need for further surgery, dislocation, anesthetic risks, blood clots, risks of death,and others. The probabilities of surgical success and failure discussed with patient given their particular co-morbidities.The time and nature of expected rehabilitation and recovery was discussed.The patient's questions were all answered preoperatively.  No barriers to understanding were noted. I explained the natural history of the disease process and Rx rationale.  I explained to the patient what I considered to be reasonable expectations given their personal situation.  The final treatment plan was arrived at through a shared patient decision making process model.   Thank you for the consult and the opportunity to see Veronica Cortez  Vanetta Mulders, MD [facility] 4:32 PM

## 2021-10-18 NOTE — ED Provider Notes (Signed)
[facility] EMERGENCY DEPARTMENT Provider Note   CSN: [id number] Arrival date & time: 10/18/21  1226     History  Chief Complaint  Patient presents with   Veronica Cortez is a 51 y.o. female.  Pt is a 51 yo female with a hx of GERD, dm, ibd, asthma, neuropathy, migraines, and depression.  Veronica Cortez bumped into a wall and fell right on her right hip.  Veronica Cortez has 10/10 pain in the hip.  No deformity noted.  No other injuries.  Veronica Cortez denies hitting her head.  Veronica Cortez is not on blood thinners.  Veronica Cortez said Veronica Cortez falls frequently due to neuropathy.  Veronica Cortez has recently moved to [facility] to take care of her mom.  Veronica Cortez has not  yet taken her normal meds today.      Home Medications Prior to Admission medications   Medication Sig Start Date End Date Taking? Authorizing Provider  ADVAIR HFA 115-21 MCG/ACT inhaler Inhale 2 puffs into the lungs 2 (two) times daily. 03/20/21   [provider]  AJOVY 225 MG/1.5ML SOAJ Inject 1.5 mLs into the skin every 30 (thirty) days. 04/09/21   [provider]  albuterol (VENTOLIN HFA) 108 (90 Base) MCG/ACT inhaler Inhale 2 puffs into the lungs every 4 (four) hours as needed for wheezing or shortness of breath. 03/17/21   [provider]  amitriptyline (ELAVIL) 100 MG tablet Take 100 mg by mouth at bedtime. 04/08/21   [provider]  aspirin-acetaminophen-caffeine (EXCEDRIN MIGRAINE) [phone] MG tablet Take 2 tablets by mouth every 6 (six) hours as needed for headache.    [provider]  atenolol (TENORMIN) 25 MG tablet Take 25 mg by mouth daily. 04/08/21   [provider]  dicyclomine (BENTYL) 10 MG capsule Take 1 capsule (10 mg total) by mouth 3 (three) times daily before meals. 04/18/21 05/18/21  [name], MD  FLUoxetine (PROZAC) 40 MG capsule Take 40 mg by mouth daily. 04/08/21   [provider]  Insulin Aspart FlexPen (NOVOLOG) 100 UNIT/ML Inject 10-16 Units into the skin 3  (three) times daily. 04/11/21   [provider]  JANUVIA 100 MG tablet Take 100 mg by mouth daily. 04/08/21   [provider]  LANTUS SOLOSTAR 100 UNIT/ML Solostar Pen Inject 30 Units into the skin at bedtime. 03/18/21   [provider]  Melatonin 10 MG TABS Take 10 mg by mouth at bedtime.    [provider]  metFORMIN (GLUCOPHAGE) 1000 MG tablet Take 1,000 mg by mouth 2 (two) times daily. 03/18/21   [provider]  montelukast (SINGULAIR) 10 MG tablet Take 10 mg by mouth daily. 03/17/21   [provider]  Omega-3 Fatty Acids (FISH OIL PO) Take 1 capsule by mouth daily.    [provider]  omeprazole (PRILOSEC) 20 MG capsule Take 20 mg by mouth 2 (two) times daily. 04/08/21   [provider]  Probiotic Product (PROBIOTIC PO) Take 1 capsule by mouth daily.    [provider]  promethazine (PHENERGAN) 25 MG tablet Take 25 mg by mouth daily as needed for nausea/vomiting. 04/08/21   [provider]  Riboflavin (B2 PO) Take 1 tablet by mouth daily.    [provider]  rosuvastatin (CRESTOR) 10 MG tablet Take 10 mg by mouth at bedtime. 04/08/21   [provider]  topiramate (TOPAMAX) 200 MG tablet Take 200 mg by mouth 2 (two) times daily. 03/18/21   [provider]  UBRELVY 100 MG TABS Take 100 mg by mouth 2 (two) times daily as needed for migraine. 04/09/21   [provider]  VITAMIN E PO Take 1 tablet by mouth daily.    [provider]      Allergies    Benadryl [diphenhydramine], Compazine [prochlorperazine], Lamictal [lamotrigine], Other, and Reglan [metoclopramide]    Review of Systems   Review of Systems  Musculoskeletal:        Right hip pain  All other systems reviewed and are negative.  Physical Exam Updated Vital Signs BP 128/78    Pulse 79    Temp 98.6 F (37 C) (Oral)    Resp 20    Ht 5\' 8"  (1.727 m)    Wt 98.9 kg    SpO2 97%    BMI 33.15 kg/m  Physical  Exam Vitals and nursing note reviewed.  Constitutional:      Appearance: Normal appearance.  HENT:     Head: Normocephalic and atraumatic.     Right Ear: External ear normal.     Left Ear: External ear normal.     Nose: Nose normal.     Mouth/Throat:     Mouth: Mucous membranes are moist.     Pharynx: Oropharynx is clear.  Eyes:     Extraocular Movements: Extraocular movements intact.     Conjunctiva/sclera: Conjunctivae normal.     Pupils: Pupils are equal, round, and reactive to light.  Cardiovascular:     Rate and Rhythm: Normal rate and regular rhythm.     Pulses: Normal pulses.     Heart sounds: Normal heart sounds.  Pulmonary:     Effort: Pulmonary effort is normal.     Breath sounds: Normal breath sounds.  Abdominal:     General: Abdomen is flat. Bowel sounds are normal.     Palpations: Abdomen is soft.  Musculoskeletal:     Cervical back: Normal range of motion and neck supple.       Legs:  Skin:    General: Skin is warm.     Capillary Refill: Capillary refill takes less than 2 seconds.  Neurological:     General: No focal deficit present.     Mental Status: Veronica Cortez is alert and oriented to person, place, and time.  Psychiatric:        Mood and Affect: Mood normal.        Behavior: Behavior normal.    ED Results / Procedures / Treatments   Labs (all labs ordered are listed, but only abnormal results are displayed) Labs Reviewed  BASIC METABOLIC PANEL - Abnormal; Notable for the following components:      Result Value   Sodium 133 (*)    CO2 17 (*)    Glucose, Bld 332 (*)    All other components within normal limits  CBC WITH DIFFERENTIAL/PLATELET - Abnormal; Notable for the following components:   WBC 12.4 (*)    Neutro Abs 8.7 (*)    Abs Immature Granulocytes 0.16 (*)    All other components within normal limits  I-STAT VENOUS BLOOD GAS, ED - Abnormal; Notable for the following components:   pH, Ven 7.443 (*)    pCO2, Ven 25.0 (*)    Bicarbonate 17.1  (*)    TCO2 18 (*)    Acid-base deficit 6.0 (*)    Calcium, Ion 1.09 (*)    HCT 35.0 (*)    Hemoglobin 11.9 (*)    All other components within normal limits  RESP PANEL BY RT-PCR (FLU A&B, COVID) ARPGX2  PROTIME-INR  URINALYSIS, ROUTINE W REFLEX MICROSCOPIC  BETA-HYDROXYBUTYRIC ACID  TYPE AND SCREEN    EKG EKG Interpretation  Date/Time:  Saturday October 18 2021 13:32:57 EST Ventricular Rate:  82 PR Interval:  151 QRS Duration: 96 QT Interval:  363 QTC Calculation: 424 R Axis:   69 Text Interpretation: Sinus rhythm No old tracing to compare Confirmed by Jacalyn LefevreHaviland, Thresa Dozier [phone]([id number]) on 10/18/2021 1:37:55 PM  Radiology DG Lumbar Spine Complete  Result Date: 10/18/2021 CLINICAL DATA:  Trauma, pain EXAM: LUMBAR SPINE - COMPLETE 4+ VIEW COMPARISON:  None. FINDINGS: No recent fracture is seen. Alignment of posterior margins of vertebral bodies is unremarkable. Degenerative changes are noted, particularly severe at L4-L5 and L5-S1 levels with disc space narrowing, bony spurs and facet hypertrophy. Large amount of stool is seen in the colon. IMPRESSION: No recent fracture is seen. Lumbar spondylosis. Large stool burden in the colon. Electronically Signed   By: Ernie AvenaPalani  Rathinasamy M.D.   On: 10/18/2021 13:42   DG Knee Complete 4 Views Right  Result Date: 10/18/2021 CLINICAL DATA:  Trauma, pain EXAM: RIGHT KNEE - COMPLETE 4+ VIEW COMPARISON:  None FINDINGS: No fracture or dislocation is seen. There is no significant effusion in the suprapatellar bursa. There is patchy sclerosis in the proximal fibula, possibly old bone infarct. Small calcification at the attachment of quadriceps tendon to the patella may suggest calcific tendinosis. IMPRESSION: No recent fracture or dislocation is seen in the right knee. Electronically Signed   By: Ernie AvenaPalani  Rathinasamy M.D.   On: 10/18/2021 13:40   DG Hip Unilat With Pelvis 2-3 Views Right  Result Date: 10/18/2021 CLINICAL DATA:  Trauma, pain EXAM: DG HIP (WITH OR  WITHOUT PELVIS) 2-3V RIGHT COMPARISON:  None. FINDINGS: Subcapital fracture is seen in the neck of right femur. There is no dislocation. IMPRESSION: Impacted fracture is seen in the subcapital portion of neck of right femur. Electronically Signed   By: Ernie AvenaPalani  Rathinasamy M.D.   On: 10/18/2021 13:38    Procedures Procedures    Medications Ordered in ED Medications  sodium chloride 0.9 % bolus 1,000 mL (1,000 mLs Intravenous New Bag/Given 10/18/21 1326)    And  0.9 %  sodium chloride infusion (has no administration in time range)  morphine (PF) 4 MG/ML injection 4 mg (4 mg Intravenous Given 10/18/21 1333)  ondansetron (ZOFRAN) injection 4 mg (4 mg Intravenous Given 10/18/21 1328)    ED Course/ Medical Decision Making/ A&P                           Medical Decision Making Amount and/or Complexity of Data Reviewed Labs: ordered. Radiology: ordered.  Risk Prescription drug management. Decision regarding hospitalization.   This patient presents to the ED for concern of right hip pain after fall, this involves an extensive number of treatment options, and is a complaint that carries with it a high risk of complications and morbidity.  The differential diagnosis includes fracture, contusion   Co morbidities that complicate the patient evaluation  GERD, dm, ibd, asthma, neuropathy, migraines, and depression   Additional history obtained:  Additional history obtained from epic chart review External records from outside source obtained and reviewed including EMS report   Lab Tests:  I Ordered, and personally interpreted labs.  The pertinent results include:  cbc with wbc 12.4.  bmp glucose of 332, VBG with pH of 7.44   Imaging Studies ordered:  I  ordered imaging studies including lumbar spine, hip, knee  I independently visualized and interpreted imaging which showed lumbar:   IMPRESSION:  No recent fracture is seen. Lumbar spondylosis. Large stool burden  in the colon.   Knee:    IMPRESSION:  No recent fracture or dislocation is seen in the right knee.  Hip:   IMPRESSION:  Impacted fracture is seen in the subcapital portion of neck of right  femur.   I agree with the radiologist interpretation   Cardiac Monitoring:  The patient was maintained on a cardiac monitor.  I personally viewed and interpreted the cardiac monitored which showed an underlying rhythm of: nsr   Medicines ordered and prescription drug management:  I ordered medication including morphine  for pain  Reevaluation of the patient after these medicines showed that the patient improved I have reviewed the patients home medicines and have made adjustments as needed   Test Considered:  xrays   Critical Interventions:  Pain medicine   Consultations Obtained:  I requested consultation with the orthopedist (Dr. Steward Drone),  and discussed lab and imaging findings as well as pertinent plan - he will see in consult.  Admit to medicine. Pt d/w FP residents for admission.   Problem List / ED Course:  Right hip fracture:  per Ortho.  Will need OR. Hyperglycemia:  pt has not had her normal meds today.  Pt is not acidotic, so doubt DKA. Beta-hydroxybutyric acid test ordered.   Reevaluation:  After the interventions noted above, I reevaluated the patient and found that they have :improved   Social Determinants of Health:  Lives at home   Dispostion:  After consideration of the diagnostic results and the patients response to treatment, I feel that the patent would benefit from admission.          Final Clinical Impression(s) / ED Diagnoses Final diagnoses:  Fall, initial encounter  Closed subcapital fracture of right femur, initial encounter (HCC)  Hyperglycemia due to diabetes mellitus [facility])    Rx / DC Orders ED Discharge Orders     None         Jacalyn Lefevre, MD 10/18/21 1430

## 2021-10-18 NOTE — Brief Op Note (Signed)
? ?  Brief Op Note ? ?Date of Surgery: ?10/18/2021 ? ?Preoperative Diagnosis: ?right hip fracture ? ?Postoperative Diagnosis: ?same ? ?Procedure: ?Procedure(s): ?CANNULATED HIP PINNING ? ?Implants: ?Implant Name Type Inv. Item Serial No. Manufacturer Lot No. LRB No. Used Action  ?SCREW CANNULATED 6.5X95 - [id number] Screw SCREW CANNULATED 6.5X95  [employer](OR[employer])  Right 1 Implanted  ?SCREW CANNULATED 6.5X85MM - [id number] Screw SCREW CANNULATED 6.5X85MM  [employer](ORTH,TRAU,BIO,SG)  Right 2 Implanted  ? ? ?Surgeons: ?Surgeon(s): ?[name], MD ? ?Anesthesia: ?General ? ? ? ?Estimated Blood Loss: ?See anesthesia record ? ?Complications: ?None ? ?Condition to PACU: ?Stable ? ?Benancio Deeds, MD ?10/18/2021 ?5:55 PM ? ?

## 2021-10-18 NOTE — Interval H&P Note (Signed)
History and Physical Interval Note: ? ?10/18/2021 ?4:35 PM ? ?Veronica Cortez  has presented today for surgery, with the diagnosis of right hip fracture.  The various methods of treatment have been discussed with the patient and family. After consideration of risks, benefits and other options for treatment, the patient has consented to  Procedure(s): ?CANNULATED HIP PINNING (Right) as a surgical intervention.  The patient's history has been reviewed, patient examined, no change in status, stable for surgery.  I have reviewed the patient's chart and labs.  Questions were answered to the patient's satisfaction.   ? ? ?[name] ? ? ?

## 2021-10-18 NOTE — H&P (Addendum)
[facility] Admission History and Physical Service Pager: [phone]  Patient name: Veronica Cortez Medical record number: [id number] Date of birth: 12/22/1970 Age: 51 y.o. Gender: female  Primary Care Provider: Ellyn HackShah, Syed Asad A, MD Consultants: Orthopedics Surgery Code Status: Full  Preferred Emergency Contact: Irish LackJohn Vipond (brother) [phone]   Chief Complaint: Fall  Assessment and Plan: Veronica Cortez is a 51 y.o. female presenting with excruciating right hip pain following a fall. PMH is significant for T2DM, diabetic neuropathy, GERD, IBD, asthma, migraines, and depression.   Subcapital Fracture of R Femoral Neck Right Hip Pain Patient reports she bumped into a wall and fell right on her right hip. 10/10 pain in the hip. No deformity noted. No other injuries. No head injury. Denies LOC. No other injuries. Not on blood thinner. Reports frequent falls due to diabetic neuropathy.  Reports having 3 falls in the past year.  L-spine, hip, and knee XR performed. Subcapital fracture is seen in neck of right femur. L-spine shows lumbar spondylosis. No knee fracture/dislocation noted. EDP has already consulted Ortho for surgery. Received IV morphine which has lowered pain to 6/10. Denying any other complaints at this time. -Admit to FPTS to med-surg, attending Dr. Lum BabeEniola -Ortho consulted, appreciate recommendations and assistance -Pain meds: IV morphine 2 mg q4 hour PRN for moderate pain (<6/10), IV morphine 4 mg q4 hour PRN for severe pain (>6/10) x 2 doses -NPO at this time in case ortho decides to take patient to surgery today -Strict bedrest -Left SCDs, cannot tolerate SCD on R leg at this time  Type 2 DM Blood glucose 332.  Home med: januvia 100 mg qd, lantus 30 mg qhs, short acting insulin 10-16 units, metformin 1000 mg bid -A1c ordered -Sensitive SSI -Will adjust after surgery   Migraines Home med: ajovy, amitriptyline, topamax,  ubrelvy -will resume after surgery, NPO currently  HLD Home med Crestor 10 mg qhs -will resume after surgery, NPO currently  MDD Home med: Prozac 40 mg -will resume after surgery, NPO currently   FEN/GI: NPO until ortho determines surgery timing Prophylaxis: SCDs  Disposition: Home in 4-5 days pending PT/OT eval and surgical intervention for femoral neck fractue  History of Present Illness:  Veronica Cortez is a 51 y.o. female presenting following a fall and severe right hip pain.  Patient reports she had bumped her shoulder onto a wall which led to her falling directly onto her right hip.  Patient was able to move off right hip and most able to catch; however due to severe pain and inability to bear weight on the right leg, patient called 911 and EMS brought her to the ED.  Patient denies feeling dizzy prior to the fall, denies LOC, denies feeling like she was going to pass out.  Patient reports pain was 10 out of 10 in the right hip.  Patient received two 4 mg doses of IV morphine which lowered her pain to 6 out of 10.  Patient sees Dr. Sherryll BurgerShah as her primary care provider.  Patient reports she has had 3 falls in the past year due to her diabetic neuropathy.  Patient reports she had just seen her PCP yesterday.  Patient reports having back surgery and total hysterectomy.  Patient denies allergy to anesthesia.  Patient denies tobacco use in the past decade.  Patient denies illicit substances in the past decade.  Patient endorses 1 to 2 glasses of wine every month.  Review Of Systems: Per HPI with the  following additions:   Review of Systems  Constitutional:  Negative for fatigue and fever.  HENT:  Negative for congestion.   Respiratory:  Negative for choking, chest tightness and shortness of breath.   Cardiovascular:  Negative for chest pain.  Gastrointestinal:  Negative for abdominal distention and abdominal pain.  Musculoskeletal:  Negative for back pain.  Neurological:  Negative  for dizziness, numbness and headaches.    Patient Active Problem List   Diagnosis Date Noted   Acute hemorrhagic colitis 04/16/2021   Insulin-requiring or dependent type II diabetes mellitus (HCC)    Asthma    Essential hypertension    Depression     Past Medical History: Past Medical History:  Diagnosis Date   Asthma    Depression    Diabetes mellitus without complication (HCC)    GERD (gastroesophageal reflux disease)    IBD (inflammatory bowel disease)    Migraine    Neuropathy     Past Surgical History: Past Surgical History:  Procedure Laterality Date   ABDOMINAL HYSTERECTOMY     BACK SURGERY     ENDOSCOPIC PLANTAR FASCIOTOMY     HAND SURGERY     TUBAL LIGATION      Social History: Social History   Tobacco Use   Smoking status: Former    Types: Cigarettes    Quit date: 2012    Years since quitting: 11.1   Smokeless tobacco: Never  Vaping Use   Vaping Use: Never used  Substance Use Topics   Alcohol use: Yes    Alcohol/week: 2.0 standard drinks    Types: 2 Glasses of wine per week   Drug use: Never   Additional social history:  Please also refer to relevant sections of EMR.  Family History: Family History  Problem Relation Age of Onset   Breast cancer Mother    Kidney failure Father    Allergies and Medications: Allergies  Allergen Reactions   Benadryl [Diphenhydramine]    Compazine [Prochlorperazine]    Lamictal [Lamotrigine]    Other     Triptans and statins   Reglan [Metoclopramide]    No current facility-administered medications on file prior to encounter.   Current Outpatient Medications on File Prior to Encounter  Medication Sig Dispense Refill   ADVAIR HFA 115-21 MCG/ACT inhaler Inhale 2 puffs into the lungs 2 (two) times daily.     AJOVY 225 MG/1.5ML SOAJ Inject 1.5 mLs into the skin every 30 (thirty) days.     albuterol (VENTOLIN HFA) 108 (90 Base) MCG/ACT inhaler Inhale 2 puffs into the lungs every 4 (four) hours as needed for  wheezing or shortness of breath.     amitriptyline (ELAVIL) 100 MG tablet Take 100 mg by mouth at bedtime.     aspirin-acetaminophen-caffeine (EXCEDRIN MIGRAINE) 250-250-65 MG tablet Take 2 tablets by mouth every 6 (six) hours as needed for headache.     atenolol (TENORMIN) 25 MG tablet Take 25 mg by mouth daily.     dicyclomine (BENTYL) 10 MG capsule Take 1 capsule (10 mg total) by mouth 3 (three) times daily before meals. 90 capsule 0   FLUoxetine (PROZAC) 40 MG capsule Take 40 mg by mouth daily.     Insulin Aspart FlexPen (NOVOLOG) 100 UNIT/ML Inject 10-16 Units into the skin 3 (three) times daily.     JANUVIA 100 MG tablet Take 100 mg by mouth daily.     LANTUS SOLOSTAR 100 UNIT/ML Solostar Pen Inject 30 Units into the skin at bedtime.  Melatonin 10 MG TABS Take 10 mg by mouth at bedtime.     metFORMIN (GLUCOPHAGE) 1000 MG tablet Take 1,000 mg by mouth 2 (two) times daily.     montelukast (SINGULAIR) 10 MG tablet Take 10 mg by mouth daily.     Omega-3 Fatty Acids (FISH OIL PO) Take 1 capsule by mouth daily.     omeprazole (PRILOSEC) 20 MG capsule Take 20 mg by mouth 2 (two) times daily.     Probiotic Product (PROBIOTIC PO) Take 1 capsule by mouth daily.     promethazine (PHENERGAN) 25 MG tablet Take 25 mg by mouth daily as needed for nausea/vomiting.     Riboflavin (B2 PO) Take 1 tablet by mouth daily.     rosuvastatin (CRESTOR) 10 MG tablet Take 10 mg by mouth at bedtime.     topiramate (TOPAMAX) 200 MG tablet Take 200 mg by mouth 2 (two) times daily.     UBRELVY 100 MG TABS Take 100 mg by mouth 2 (two) times daily as needed for migraine.     VITAMIN E PO Take 1 tablet by mouth daily.      Objective: BP 117/74    Pulse 87    Temp 98.6 F (37 C) (Oral)    Resp (!) 24    Ht 5\' 8"  (1.727 m)    Wt 218 lb (98.9 kg)    SpO2 100%    BMI 33.15 kg/m  Exam: General: Resting in bed.  Mildly uncomfortable due to pain. Eyes: PERRL Cardiovascular: RRR, no murmurs noted Respiratory: Breathing  comfortably.  CTA B Gastrointestinal: Soft, nontender to palpation MSK: able to move extremities but excruciating right hip pain moving RLE. No deformities noted Neuro: no focal deficit present  Labs and Imaging: CBC BMET  Recent Labs  Lab 10/18/21 1257  WBC 12.4*  HGB 12.5  HCT 39.2  PLT 258   Recent Labs  Lab 10/18/21 1257  NA 133*  K 3.7  CL 103  CO2 17*  BUN 13  CREATININE 0.72  GLUCOSE 332*  CALCIUM 9.2     EKG: My own interpretation (not copied from electronic read) NSR. No abnormalities noted     12/18/21, MD 10/18/2021, 2:12 PM PGY-1, [facility] FPTS Intern pager: [phone], text pages welcome

## 2021-10-19 DIAGNOSIS — S72001A Fracture of unspecified part of neck of right femur, initial encounter for closed fracture: Secondary | ICD-10-CM | POA: Diagnosis not present

## 2021-10-19 LAB — URINALYSIS, ROUTINE W REFLEX MICROSCOPIC
Bilirubin Urine: NEGATIVE
Glucose, UA: 50 mg/dL — AB
Hgb urine dipstick: NEGATIVE
Ketones, ur: NEGATIVE mg/dL
Leukocytes,Ua: NEGATIVE
Nitrite: NEGATIVE
Protein, ur: NEGATIVE mg/dL
Specific Gravity, Urine: 1.01 (ref 1.005–1.030)
pH: 5 (ref 5.0–8.0)

## 2021-10-19 LAB — CBC
HCT: 36.4 % (ref 36.0–46.0)
Hemoglobin: 11.6 g/dL — ABNORMAL LOW (ref 12.0–15.0)
MCH: 26.2 pg (ref 26.0–34.0)
MCHC: 31.9 g/dL (ref 30.0–36.0)
MCV: 82.2 fL (ref 80.0–100.0)
Platelets: 242 K/uL (ref 150–400)
RBC: 4.43 MIL/uL (ref 3.87–5.11)
RDW: 14.1 % (ref 11.5–15.5)
WBC: 12.1 K/uL — ABNORMAL HIGH (ref 4.0–10.5)
nRBC: 0 % (ref 0.0–0.2)

## 2021-10-19 LAB — BASIC METABOLIC PANEL
Anion gap: 9 (ref 5–15)
BUN: 11 mg/dL (ref 6–20)
CO2: 18 mmol/L — ABNORMAL LOW (ref 22–32)
Calcium: 8.2 mg/dL — ABNORMAL LOW (ref 8.9–10.3)
Chloride: 104 mmol/L (ref 98–111)
Creatinine, Ser: 0.71 mg/dL (ref 0.44–1.00)
GFR, Estimated: >60 mL/min (ref >60)
Glucose, Bld: 299 mg/dL — ABNORMAL HIGH (ref 70–99)
Potassium: 3.8 mmol/L (ref 3.5–5.1)
Sodium: 131 mmol/L — ABNORMAL LOW (ref 135–145)

## 2021-10-19 LAB — GLUCOSE, CAPILLARY
Glucose-Capillary: 156 mg/dL — ABNORMAL HIGH (ref 70–99)
Glucose-Capillary: 194 mg/dL — ABNORMAL HIGH (ref 70–99)
Glucose-Capillary: 292 mg/dL — ABNORMAL HIGH (ref 70–99)
Glucose-Capillary: 305 mg/dL — ABNORMAL HIGH (ref 70–99)
Glucose-Capillary: 309 mg/dL — ABNORMAL HIGH (ref 70–99)
Glucose-Capillary: 331 mg/dL — ABNORMAL HIGH (ref 70–99)
Glucose-Capillary: 349 mg/dL — ABNORMAL HIGH (ref 70–99)

## 2021-10-19 LAB — VITAMIN B12: Vitamin B-12: 1212 pg/mL — ABNORMAL HIGH (ref 180–914)

## 2021-10-19 MED ORDER — ENOXAPARIN SODIUM 40 MG/0.4ML IJ SOSY
40.0000 mg | PREFILLED_SYRINGE | INTRAMUSCULAR | Status: DC
  Administered 2021-10-19: 40 mg via SUBCUTANEOUS
  Filled 2021-10-19: qty 0.4

## 2021-10-19 MED ORDER — INSULIN GLARGINE-YFGN 100 UNIT/ML ~~LOC~~ SOLN
15.0000 [IU] | Freq: Every day | SUBCUTANEOUS | Status: DC
  Administered 2021-10-19 – 2021-10-20 (×2): 15 [IU] via SUBCUTANEOUS
  Filled 2021-10-19 (×2): qty 0.15

## 2021-10-19 MED ORDER — DICYCLOMINE HCL 10 MG PO CAPS
10.0000 mg | ORAL_CAPSULE | Freq: Two times a day (BID) | ORAL | Status: DC
  Administered 2021-10-19 – 2021-10-20 (×3): 10 mg via ORAL
  Filled 2021-10-19 (×4): qty 1

## 2021-10-19 MED ORDER — UBROGEPANT 100 MG PO TABS
100.0000 mg | ORAL_TABLET | Freq: Two times a day (BID) | ORAL | Status: DC | PRN
  Administered 2021-10-19: 100 mg via ORAL
  Filled 2021-10-19: qty 1

## 2021-10-19 MED ORDER — AMITRIPTYLINE HCL 50 MG PO TABS
100.0000 mg | ORAL_TABLET | Freq: Every day | ORAL | Status: DC
  Administered 2021-10-19: 100 mg via ORAL
  Filled 2021-10-19: qty 2

## 2021-10-19 MED ORDER — TOPIRAMATE 25 MG PO TABS
200.0000 mg | ORAL_TABLET | Freq: Two times a day (BID) | ORAL | Status: DC
  Administered 2021-10-19 – 2021-10-20 (×3): 200 mg via ORAL
  Filled 2021-10-19 (×3): qty 8

## 2021-10-19 MED ORDER — DICYCLOMINE HCL 10 MG PO CAPS
10.0000 mg | ORAL_CAPSULE | Freq: Three times a day (TID) | ORAL | Status: DC
  Filled 2021-10-19 (×2): qty 1

## 2021-10-19 MED ORDER — FLUOXETINE HCL 20 MG PO CAPS
40.0000 mg | ORAL_CAPSULE | Freq: Every day | ORAL | Status: DC
  Administered 2021-10-19 – 2021-10-20 (×2): 40 mg via ORAL
  Filled 2021-10-19 (×2): qty 2

## 2021-10-19 MED ORDER — ACETAMINOPHEN 325 MG PO TABS
650.0000 mg | ORAL_TABLET | Freq: Four times a day (QID) | ORAL | Status: DC | PRN
  Administered 2021-10-19 – 2021-10-20 (×6): 650 mg via ORAL
  Filled 2021-10-19 (×6): qty 2

## 2021-10-19 MED ORDER — MELATONIN 5 MG PO TABS
10.0000 mg | ORAL_TABLET | Freq: Every day | ORAL | Status: DC
  Administered 2021-10-19: 10 mg via ORAL
  Filled 2021-10-19: qty 2

## 2021-10-19 MED ORDER — PANTOPRAZOLE SODIUM 40 MG PO TBEC
40.0000 mg | DELAYED_RELEASE_TABLET | Freq: Every day | ORAL | Status: DC
  Administered 2021-10-19 – 2021-10-20 (×2): 40 mg via ORAL
  Filled 2021-10-19 (×2): qty 1

## 2021-10-19 MED ORDER — POLYETHYLENE GLYCOL 3350 17 G PO PACK
17.0000 g | PACK | Freq: Every day | ORAL | Status: DC
  Administered 2021-10-19 – 2021-10-20 (×2): 17 g via ORAL
  Filled 2021-10-19 (×2): qty 1

## 2021-10-19 MED ORDER — ROSUVASTATIN CALCIUM 5 MG PO TABS
10.0000 mg | ORAL_TABLET | Freq: Every day | ORAL | Status: DC
Start: 2021-10-19 — End: 2021-10-20
  Administered 2021-10-19: 10 mg via ORAL
  Filled 2021-10-19: qty 2

## 2021-10-19 MED ORDER — INSULIN ASPART 100 UNIT/ML IJ SOLN
0.0000 [IU] | Freq: Three times a day (TID) | INTRAMUSCULAR | Status: DC
  Administered 2021-10-19: 3 [IU] via SUBCUTANEOUS
  Administered 2021-10-19: 11 [IU] via SUBCUTANEOUS
  Administered 2021-10-19: 8 [IU] via SUBCUTANEOUS
  Administered 2021-10-20: 3 [IU] via SUBCUTANEOUS
  Administered 2021-10-20: 5 [IU] via SUBCUTANEOUS

## 2021-10-19 MED ORDER — MONTELUKAST SODIUM 10 MG PO TABS
10.0000 mg | ORAL_TABLET | Freq: Every day | ORAL | Status: DC
  Administered 2021-10-19 – 2021-10-20 (×2): 10 mg via ORAL
  Filled 2021-10-19 (×2): qty 1

## 2021-10-19 MED ORDER — ATENOLOL 25 MG PO TABS
25.0000 mg | ORAL_TABLET | Freq: Every day | ORAL | Status: DC
  Administered 2021-10-19 – 2021-10-20 (×2): 25 mg via ORAL
  Filled 2021-10-19 (×2): qty 1

## 2021-10-19 MED ORDER — POLYETHYLENE GLYCOL 3350 17 G PO PACK: 17.0000 g | PACK | Freq: Every day | ORAL | Status: DC | PRN

## 2021-10-19 NOTE — Progress Notes (Signed)
FPTS Interim Progress Note ? ?S:Patient reports hx of migraines and has a headache. She has pain in her leg s/p surgical fixation. She has gotten up to use restroom.  ? ?O: ?BP 110/63 (BP Location: Right Arm)   Pulse 75   Temp 97.8 ?F (36.6 ?C) (Oral)   Resp 16   Ht 5\' 8"  (1.727 m)   Wt 99.2 kg   SpO2 95%   BMI 33.24 kg/m?   ? ?GEN: alert, overall comfortable appearing  ?RESP: equal chest rise and fall ?MSK: ale to move toes  ? ?A/P: ?Rounded with primary RN.  Tylenol added for headache. Appreciated nightly round. ?See daily progress note.  ?- Orders reviewed. Labs for AM ordered, which was adjusted as needed.  ? Lyndee Hensen, DO ?10/19/2021, 1:03 AM ?PGY-3, [place] [facility] Night Resident  ?Please page [phone] with questions.  ? ?

## 2021-10-19 NOTE — Progress Notes (Signed)
FPTS Interim Night Progress Note ? ?S:Patient sleeping comfortably.  Rounded with primary night RN. Patient had been having pain 8/10 with no relief from Morphine 4 mg or Tylenol.  Received Oxy IR 5 mg and seemed to help some.   ? ?O: ?Today's Vitals  ? 10/19/21 1510 10/19/21 1926 10/19/21 1947 10/19/21 2000  ?BP:   119/73   ?Pulse:   71   ?Resp:   14   ?Temp:   97.9 ?F (36.6 ?C)   ?TempSrc:   Oral   ?SpO2:   95%   ?Weight:      ?Height:      ?PainSc: 4  10-Worst pain ever  7   ? ? ? ? ?A/P: ?Pain management s/p right hip screw fixation ?Received Morphine 10 mg total throughout day shift and reported had not helped.  Tried increased dose of Morphine 4 mg without relief.  Additionally given Tylenol with minimal relief. ?Oxy IR 5mg  x 1 dose ?Will have day team reassess pain regime for better control.  ? ? ? MD ?PGY-3, [facility] ?Service pager [phone]   ?

## 2021-10-19 NOTE — Progress Notes (Signed)
PT Cancellation Note ? ?Patient Details ?Name: Veronica Cortez ?MRN: [id number] ?DOB: Jul 02, 1971 ? ? ?Cancelled Treatment:    Reason Eval/Treat Not Completed: Pain limiting ability to participate ?Patient requests therapist to follow-up at a later time due to severe migraine. She is currently waiting for a medication, but willing to participate later. Will follow-up for comprehensive evaluation. ? ?Berton Mount ?10/19/2021, 8:44 AM ?

## 2021-10-19 NOTE — Progress Notes (Signed)
Family Medicine Teaching Service ?Daily Progress Note ?Intern Pager: [phone] ? ?Patient name: Veronica Cortez Medical record number: [id number] ?Date of birth: May 09, 1971 Age: 51 y.o. Gender: female ? ?Primary Care Provider: Ellyn Hack, MD ?Consultants: orthopedic surgery  ?Code Status: FULL ? ?Pt Overview and Major Events to Date:  ?Veronica Cortez is a 51 y.o. female presenting with excruciating right hip pain following a fall. PMH is significant for T2DM, diabetic neuropathy, GERD, IBD, asthma, migraines, and depression.  ? ?Assessment and Plan: ?Veronica Cortez is a 51 y.o. female presenting with excruciating right hip pain following a fall. PMH is significant for T2DM, diabetic neuropathy, GERD, IBD, asthma, migraines, and depression.  ? ?Subcapital Fracture of right femoral neck s/p surgical fixation  Hx of frequent falls  ?Post-op day 1. Transferring to bedside commode. Has some pain but overall well controlled.  ?- orthopedic surgery following, appreciate recommendations; follow up outpt in 2 weeks for staple removal  ?-pain control: Morphine IV, Tylenol PO ?-RLE weightbearing as tolerated  ?-PT / OT eval and treat ?-follow up B12  ?-fall precautions  ?-s/p Ancef  ? ?Type 2 Diabetes Mellitus; chronic and stable  ?CBG ranged 175 - 349 in  the last 24 hours.   ?- Restart long acting insulin: semglee 15 u daily (1/2 home dose) ?- switch to moderate sliding scale insulin  ?- carb modified diet  ?- Hold home januvia, metformin ?- follow up a1c  ?- monitor CBGs ?-follow up B12 as taking metformin  ? ?Migraines; stable, chronic  ?Pt reports headaches when she doesn't eat enough food. She was NPO for most of yesterday. Received Tylenol overnight for headache without relief.  ?- Restart home atenolol, Topamax and amitriptyline  ?- Hold home ubrevly (not available on formulary)  and ajovy (monthly injectable) ?- Consider sumatriptan if pt believes it will help   ? ?HLD; chronic and stable   ?-continue home crestor  ? ?MDD; chronic and stable  ?-Continue home prozac  ? ?IBS; chronic and stable  ?- continue home Bentyl BID ?- Miralax daily ? ?GERD; chronic and stable  ?- switch to protonix per formulary  ? ?COPD; chronic and stable   ?-continue home Dulera  ? ?Difficulty Sleeping; chronic and stable  ?- continue home melatonin 10 mg  ? ? ?FEN/GI: carb modified diet  ?PPx: ASA 325 mg, switch if Lovenox when able  ? ?Disposition: pending surgical clearance ? ?Subjective:  ?Continues to have migraine headache. Tylenol did not help. She reports not eating enough is one of her triggers.  Leg pain is well controlled.  ? ?Objective: ?Temp:  [97.8 ?F (36.6 ?C)-98.6 ?F (37 ?C)] 98.2 ?F (36.8 ?C) (03/12 0416) ?Pulse Rate:  [67-95] 67 (03/12 0416) ?Resp:  [10-25] 18 (03/12 0416) ?BP: (99-136)/(55-91) 99/55 (03/12 0416) ?SpO2:  [91 %-100 %] 100 % (03/12 0416) ?Weight:  [98.9 kg-99.2 kg] 99.2 kg (03/11 1624) ? ? ?Physical Exam: ?General: well appearing female in no acute distress  ?Cardiovascular: RRR  ?Respiratory: clear to ascultation bilaterally, equal chest rise and fall  ?Abdomen: soft, non-tender, non-distended  ?Extremities: s/p right hip replacement, able to get to bedside commode across the room, moving toes bilaterally, good DP pulse  ? ?Laboratory: ?Recent Labs  ?Lab 10/18/21 ?1257 10/18/21 ?1420 10/19/21 ?0040  ?WBC 12.4*  --  12.1*  ?HGB 12.5 11.9* 11.6*  ?HCT 39.2 35.0* 36.4  ?PLT 258  --  242  ? ?Recent Labs  ?Lab 10/18/21 ?1257 10/18/21 ?1420  ?NA  133* 139  ?K 3.7 3.5  ?CL 103  --   ?CO2 17*  --   ?BUN 13  --   ?CREATININE 0.72  --   ?CALCIUM 9.2  --   ?GLUCOSE 332*  --   ? ? ? ? ?Imaging/Diagnostic Tests: ?DG Lumbar Spine Complete ? ?Result Date: 10/18/2021 ?CLINICAL DATA:  Trauma, pain EXAM: LUMBAR SPINE - COMPLETE 4+ VIEW COMPARISON:  None. FINDINGS: No recent fracture is seen. Alignment of posterior margins of vertebral bodies is unremarkable. Degenerative changes are noted, particularly severe at  L4-L5 and L5-S1 levels with disc space narrowing, bony spurs and facet hypertrophy. Large amount of stool is seen in the colon. IMPRESSION: No recent fracture is seen. Lumbar spondylosis. Large stool burden in the colon. Electronically Signed   By: Ernie Avena M.D.   On: 10/18/2021 13:42  ? ?DG Knee Complete 4 Views Right ? ?Result Date: 10/18/2021 ?CLINICAL DATA:  Trauma, pain EXAM: RIGHT KNEE - COMPLETE 4+ VIEW COMPARISON:  None FINDINGS: No fracture or dislocation is seen. There is no significant effusion in the suprapatellar bursa. There is patchy sclerosis in the proximal fibula, possibly old bone infarct. Small calcification at the attachment of quadriceps tendon to the patella may suggest calcific tendinosis. IMPRESSION: No recent fracture or dislocation is seen in the right knee. Electronically Signed   By: Ernie Avena M.D.   On: 10/18/2021 13:40  ? ?DG C-Arm 1-60 Min-No Report ? ?Result Date: 10/18/2021 ?Fluoroscopy was utilized by the requesting physician.  No radiographic interpretation.  ? ?DG HIP UNILAT WITH PELVIS 2-3 VIEWS RIGHT ? ?Result Date: 10/18/2021 ?CLINICAL DATA:  Subcapital fracture RIGHT femoral neck post pinning EXAM: DG HIP (WITH OR WITHOUT PELVIS) 2-3V RIGHT COMPARISON:  10/18/2021 Fluoroscopy time: 1 minute 5.6 seconds Dose: 21.03 mGy Images: 2 FINDINGS: 3 cannulated screws placed across a subcapital fracture of the RIGHT femoral neck. No dislocation. Bones demineralized. Hip joint space preserved. IMPRESSION: Post pinning of subcapital fracture RIGHT femoral neck. Electronically Signed   By: Ulyses Southward M.D.   On: 10/18/2021 18:25  ? ?DG Hip Unilat With Pelvis 2-3 Views Right ? ?Result Date: 10/18/2021 ?CLINICAL DATA:  Trauma, pain EXAM: DG HIP (WITH OR WITHOUT PELVIS) 2-3V RIGHT COMPARISON:  None. FINDINGS: Subcapital fracture is seen in the neck of right femur. There is no dislocation. IMPRESSION: Impacted fracture is seen in the subcapital portion of neck of right femur.  Electronically Signed   By: Ernie Avena M.D.   On: 10/18/2021 13:38   ? ? ?Katha Cabal, DO   ?10/19/2021, 6:42 AM ?PGY-3, [facility] ?FPTS Intern pager: [phone], text pages welcome ? ?

## 2021-10-19 NOTE — Progress Notes (Signed)
Messaged/paged Md about pain med (allergic reaction),  pt asking to switch pain med. Pt denies SHOB, no hives, rashes/swelling. Only redness near IV site and resolves within an hour. ?

## 2021-10-19 NOTE — Evaluation (Signed)
Physical Therapy Evaluation ?Patient Details ?Name: Veronica Cortez ?MRN: [id number] ?DOB: 1971/03/02 ?Today's Date: 10/19/2021 ? ?History of Present Illness ? 51 y.o. female with Rt hip fracture after mechanical fall, s/p Rt cannulated screw placement. PMH is significant for T2DM, diabetic neuropathy, GERD, IBD, asthma, migraines, and depression.  ?Clinical Impression ? Patient is s/p above surgery resulting in functional limitations due to the deficits listed below (see PT Problem List). Doing remarkably well post-op. Able to mobilize to EOB and stand/transfer without physical assistance. Min guard for safety while ambulating with RW. Educated on technique and tolerated well. Takes care of her mother at home but also lives with her brother who will be available to assist as much as needed. Patient will benefit from skilled PT to increase their independence and safety with mobility to allow discharge to the venue listed below.   ?   ?   ? ?Recommendations for follow up therapy are one component of a multi-disciplinary discharge planning process, led by the attending physician.  Recommendations may be updated based on patient status, additional functional criteria and insurance authorization. ? ?Follow Up Recommendations Home health PT ? ?  ?Assistance Recommended at Discharge PRN  ?Patient can return home with the following ? Assistance with cooking/housework;Assist for transportation;Help with stairs or ramp for entrance ? ?  ?Equipment Recommendations Rolling walker (2 wheels);BSC/3in1  ?Recommendations for Other Services ?    ?  ?Functional Status Assessment Patient has had a recent decline in their functional status and demonstrates the ability to make significant improvements in function in a reasonable and predictable amount of time.  ? ?  ?Precautions / Restrictions Precautions ?Precautions: Fall ?Restrictions ?Weight Bearing Restrictions: Yes ?RLE Weight Bearing: Weight bearing as tolerated  ? ?   ? ?Mobility ? Bed Mobility ?Overal bed mobility: Modified Independent ?  ?  ?  ?  ?  ?  ?General bed mobility comments: extra time ?  ? ?Transfers ?Overall transfer level: Needs assistance ?Equipment used: Rolling walker (2 wheels) ?Transfers: Sit to/from Stand, Bed to chair/wheelchair/BSC ?Sit to Stand: Supervision ?Stand pivot transfers: Supervision ?  ?  ?  ?  ?  ?  ? ?Ambulation/Gait ?Ambulation/Gait assistance: Min guard ?Gait Distance (Feet): 25 Feet ?Assistive device: Rolling walker (2 wheels) ?Gait Pattern/deviations: Step-to pattern, Decreased stance time - right, Decreased stride length, Antalgic ?Gait velocity: decreased ?  ?Pre-gait activities: Pre-gait standing at EOB weight shifting ?General Gait Details: Ambulating in room with close guard for safety. Educated on AD use with rolling walker. Cues for sequencing and UE support as needed. Tolerated short distance in room this date. ? ?Stairs ?  ?  ?  ?  ?  ? ?Wheelchair Mobility ?  ? ?Modified Rankin (Stroke Patients Only) ?  ? ?  ? ?Balance Overall balance assessment: Mild deficits observed, not formally tested ?  ?  ?  ?  ?  ?  ?  ?  ?  ?  ?  ?  ?  ?  ?  ?  ?  ?  ?   ? ? ? ?Pertinent Vitals/Pain Pain Assessment ?Pain Assessment: 0-10  ? ? ?Home Living Family/patient expects to be discharged to:: Private residence ?Living Arrangements: Other relatives ?Available Help at Discharge: Family;Available 24 hours/day ?Type of Home: House ?Home Access: Stairs to enter ?Entrance Stairs-Rails: None ?Entrance Stairs-Number of Steps: 1 (threshold) ?  ?Home Layout: One level ?Home Equipment: Rollator (4 wheels);Shower seat;Grab bars - tub/shower ?   ?  ?Prior Function  Prior Level of Function : Independent/Modified Independent ?  ?  ?  ?  ?  ?  ?Mobility Comments: Reports neuropathy causing her to bump into walls at times ?ADLs Comments: Takes care of her elderly mother. ?  ? ? ?Hand Dominance  ? Dominant Hand: Right ? ?  ?Extremity/Trunk Assessment  ? Upper  Extremity Assessment ?Upper Extremity Assessment: Defer to OT evaluation ?  ? ?Lower Extremity Assessment ?Lower Extremity Assessment: RLE deficits/detail ?RLE Deficits / Details: Rt hip weakness, limited 2/2 pain ?RLE Sensation: history of peripheral neuropathy (pt reported) ?  ? ?   ?Communication  ? Communication: No difficulties  ?Cognition Arousal/Alertness: Awake/alert ?Behavior During Therapy: [facility] for tasks assessed/performed ?Overall Cognitive Status: Within Functional Limits for tasks assessed ?  ?  ?  ?  ?  ?  ?  ?  ?  ?  ?  ?  ?  ?  ?  ?  ?  ?  ?  ? ?  ?General Comments General comments (skin integrity, edema, etc.):  (Rash at IV site - RN aware, IV team came to room.) ? ?  ?Exercises General Exercises - Lower Extremity ?Ankle Circles/Pumps: AROM, Both, 10 reps, Seated ?Quad Sets: Strengthening, Both, 10 reps, Seated ?Gluteal Sets: Strengthening, 5 reps, Both, Seated ?Hip ABduction/ADduction: Strengthening, Both, 5 reps, Seated  ? ?Assessment/Plan  ?  ?PT Assessment Patient needs continued PT services  ?PT Problem List Decreased strength;Decreased range of motion;Decreased activity tolerance;Decreased balance;Decreased mobility;Decreased knowledge of use of DME;Pain ? ?   ?  ?PT Treatment Interventions DME instruction;Gait training;Stair training;Functional mobility training;Therapeutic activities;Therapeutic exercise;Balance training;Neuromuscular re-education;Patient/family education   ? ?PT Goals (Current goals can be found in the Care Plan section)  ?Acute Rehab PT Goals ?Patient Stated Goal: Return home ?PT Goal Formulation: With patient ?Time For Goal Achievement: 11/02/21 ?Potential to Achieve Goals: Good ? ?  ?Frequency Min 3X/week ?  ? ? ?Co-evaluation   ?  ?  ?  ?  ? ? ?  ?AM-PAC PT "6 Clicks" Mobility  ?Outcome Measure Help needed turning from your back to your side while in a flat bed without using bedrails?: None ?Help needed moving from lying on your back to sitting on the side of a flat bed  without using bedrails?: None ?Help needed moving to and from a bed to a chair (including a wheelchair)?: A Little ?Help needed standing up from a chair using your arms (e.g., wheelchair or bedside chair)?: A Little ?Help needed to walk in hospital room?: A Little ?Help needed climbing 3-5 steps with a railing? : A Lot ?6 Click Score: 19 ? ?  ?End of Session Equipment Utilized During Treatment: Gait belt ?Activity Tolerance: Patient tolerated treatment well ?Patient left: in chair;with call bell/phone within reach;with SCD's reapplied;with nursing/sitter in room ?  ?PT Visit Diagnosis: Unsteadiness on feet (R26.81);Other abnormalities of gait and mobility (R26.89);History of falling (Z91.81);Difficulty in walking, not elsewhere classified (R26.2);Pain ?Pain - Right/Left: Right ?Pain - part of body: Hip ?  ? ?Time: 1026-1101 ?PT Time Calculation (min) (ACUTE ONLY): 35 min ? ? ?Charges:   PT Evaluation ?$PT Eval Low Complexity: 1 Low ?PT Treatments ?$Gait Training: 8-22 mins ?  ?   ? ? ?Kathlyn [place], PT, DPT ? ?[place] ?10/19/2021, 11:59 AM ? ?

## 2021-10-19 NOTE — Progress Notes (Signed)
? ?  Subjective: ? ?Patient reports pain as better controlled after the screw fixation.  She was able to get up to the commode several times.  Tolerating food.  Although she has in the which is leading to a migraine ? ?Objective:  ? ?VITALS:   ?Vitals:  ? 10/18/21 1945 10/18/21 2021 10/19/21 0017 10/19/21 0416  ?BP: 132/71 116/67 110/63 (!) 99/55  ?Pulse: 68 75 75 67  ?Resp: 16 18 16 18   ?Temp: 98 ?F (36.7 ?C) 97.8 ?F (36.6 ?C) 97.8 ?F (36.6 ?C) 98.2 ?F (36.8 ?C)  ?TempSrc:  Oral Oral Oral  ?SpO2: 100% 96% 95% 100%  ?Weight:      ?Height:      ? ? ?Right lower extremity: Dressing is clean dry intact.  She is able to extend and flex the right knee.  She able to flex and extend great toe as well as lesser toes.  2+ dorsalis pedis pulse. ? ?Lab Results  ?Component Value Date  ? WBC 12.1 (H) 10/19/2021  ? HGB 11.6 (L) 10/19/2021  ? HCT 36.4 10/19/2021  ? MCV 82.2 10/19/2021  ? PLT 242 10/19/2021  ? ? ? ?Assessment/Plan: ? ?1 Day Post-Op status post right hip screw fixation, overall doing very well ? ?- Expected postop acute blood loss anemia - will monitor for symptoms ?- Patient to work with PT/OT to optimize mobilization safely ?- DVT ppx - SCDs, ambulation, aspirin 325 ?- Postoperative Abx: Ancef x 2 additional doses given ?- WBAT operative extremity ?- Pain control - multimodal pain management, ATC acetaminophen in conjunction with as needed narcotic (oxycodone), although this should be minimized with other modalities  ? ? ?12/19/2021 ?10/19/2021, 7:39 AM ? ?

## 2021-10-20 DIAGNOSIS — S72001A Fracture of unspecified part of neck of right femur, initial encounter for closed fracture: Secondary | ICD-10-CM | POA: Diagnosis not present

## 2021-10-20 DIAGNOSIS — W19XXXA Unspecified fall, initial encounter: Secondary | ICD-10-CM | POA: Diagnosis not present

## 2021-10-20 LAB — CBC
HCT: 32.8 % — ABNORMAL LOW (ref 36.0–46.0)
Hemoglobin: 10.4 g/dL — ABNORMAL LOW (ref 12.0–15.0)
MCH: 26.8 pg (ref 26.0–34.0)
MCHC: 31.7 g/dL (ref 30.0–36.0)
MCV: 84.5 fL (ref 80.0–100.0)
Platelets: 211 10*3/uL (ref 150–400)
RBC: 3.88 MIL/uL (ref 3.87–5.11)
RDW: 14.5 % (ref 11.5–15.5)
WBC: 8.4 10*3/uL (ref 4.0–10.5)
nRBC: 0 % (ref 0.0–0.2)

## 2021-10-20 LAB — BASIC METABOLIC PANEL
Anion gap: 7 (ref 5–15)
BUN: 10 mg/dL (ref 6–20)
CO2: 21 mmol/L — ABNORMAL LOW (ref 22–32)
Calcium: 8.3 mg/dL — ABNORMAL LOW (ref 8.9–10.3)
Chloride: 110 mmol/L (ref 98–111)
Creatinine, Ser: 0.61 mg/dL (ref 0.44–1.00)
GFR, Estimated: >60 mL/min (ref >60)
Glucose, Bld: 322 mg/dL — ABNORMAL HIGH (ref 70–99)
Potassium: 4.1 mmol/L (ref 3.5–5.1)
Sodium: 138 mmol/L (ref 135–145)

## 2021-10-20 LAB — GLUCOSE, CAPILLARY
Glucose-Capillary: 239 mg/dL — ABNORMAL HIGH (ref 70–99)
Glucose-Capillary: 163 mg/dL — ABNORMAL HIGH (ref 70–99)

## 2021-10-20 LAB — HEMOGLOBIN A1C
Hgb A1c MFr Bld: 9.4 % — ABNORMAL HIGH (ref 4.8–5.6)
Mean Plasma Glucose: 223 mg/dL

## 2021-10-20 MED ORDER — ENOXAPARIN SODIUM 40 MG/0.4ML IJ SOSY: 40.0000 mg | PREFILLED_SYRINGE | INTRAMUSCULAR | 0 refills | Status: DC

## 2021-10-20 MED ORDER — OXYCODONE HCL 5 MG PO TABS
5.0000 mg | ORAL_TABLET | Freq: Once | ORAL | Status: AC
  Administered 2021-10-20: 5 mg via ORAL
  Filled 2021-10-20: qty 1

## 2021-10-20 MED ORDER — ACETAMINOPHEN 500 MG PO TABS
1000.0000 mg | ORAL_TABLET | Freq: Three times a day (TID) | ORAL | Status: AC
Start: 2021-10-20 — End: ?

## 2021-10-20 MED ORDER — OXYCODONE HCL 5 MG PO TABS: 5.0000 mg | ORAL_TABLET | Freq: Three times a day (TID) | ORAL | 0 refills | Status: AC | PRN

## 2021-10-20 NOTE — Evaluation (Signed)
Occupational Therapy Evaluation ?Patient Details ?Name: Veronica Cortez ?MRN: [id number] ?DOB: [birth year] ?Today's Date: 10/20/2021 ? ? ?History of Present Illness 51 y.o. female with Rt hip fracture after mechanical fall, s/p Rt cannulated screw placement. PMH is significant for T2DM, diabetic neuropathy, GERD, IBD, asthma, migraines, and depression.  ? ?Clinical Impression ?  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 Pt currently min -mod A for ADLs, min guard for transfers, and mod I for bed mobility with increased time. Pt educated on use of sock aid and reacher for LB dressing, pt able to demonstrate. Pt presenting with impairments listed below, will follow acutely. Recommend HHOT at d/c.  ?   ? ?Recommendations for follow up therapy are one component of a multi-disciplinary discharge planning process, led by the attending physician.  Recommendations may be updated based on patient status, additional functional criteria and insurance authorization.  ? ?Follow Up Recommendations ? Home health OT  ?  ?Assistance Recommended at Discharge Set up Supervision/Assistance  ?Patient can return home with the following A little help with walking and/or transfers;A little help with bathing/dressing/bathroom;Assistance with cooking/housework;Help with stairs or ramp for entrance ? ?  ?Functional Status Assessment ? Patient has had a recent decline in their functional status and demonstrates the ability to make significant improvements in function in a reasonable and predictable amount of time.  ?Equipment Recommendations ? BSC/3in1  ?  ?Recommendations for Other Services   ? ? ?  ?Precautions / Restrictions Precautions ?Precautions: Fall ?Restrictions ?Weight Bearing Restrictions: Yes ?RLE Weight Bearing: Weight bearing as tolerated  ? ?  ? ?Mobility Bed Mobility ?Overal bed mobility: Modified Independent ?  ?  ?  ?  ?   ?  ?General bed mobility comments: increased time needed due to pain ?  ? ?Transfers ?Overall transfer level: Needs assistance ?Equipment used: Rolling walker (2 wheels) ?Transfers: Sit to/from Stand ?Sit to Stand: Min guard ?  ?  ?  ?  ?  ?  ?  ? ?  ?Balance Overall balance assessment: Mild deficits observed, not formally tested, Needs assistance ?Sitting-balance support: Feet supported ?Sitting balance-Leahy Scale: Normal ?  ?  ?Standing balance support: Bilateral upper extremity supported, During functional activity ?Standing balance-Leahy Scale: Fair ?Standing balance comment: stands statically at sink without LOB ?  ?  ?  ?  ?  ?  ?  ?  ?  ?  ?  ?   ? ?ADL either performed or assessed with clinical judgement  ? ?ADL Overall ADL's : Needs assistance/impaired ?Eating/Feeding: Set up;Sitting ?  ?Grooming: Set up;Wash/dry hands;Standing ?Grooming Details (indicate cue type and reason): completed standing at sink ?Upper Body Bathing: Set up;Sitting ?  ?Lower Body Bathing: Moderate assistance;Sitting/lateral leans ?  ?Upper Body Dressing : Set up;Sitting ?  ?Lower Body Dressing: Minimal assistance;With adaptive equipment;Sitting/lateral leans;Sit to/from stand ?Lower Body Dressing Details (indicate cue type and reason): performed with reacher/sock aid ?Toilet Transfer: Min guard;Rolling walker (2 wheels);Ambulation;Regular Toilet ?  ?Toileting- Clothing Manipulation and Hygiene: Supervision/safety;Sitting/lateral lean ?Toileting - Clothing Manipulation Details (indicate cue type and reason): performed pericare ?  ?  ?Functional mobility during ADLs: Min guard;Rolling walker (2 wheels) ?   ? ? ? ?Vision Baseline Vision/History: 1 Wears glasses ?Vision Assessment?: No apparent visual deficits  ?   ?Perception   ?  ?Praxis   ?  ? ?Pertinent Vitals/Pain Pain Assessment ?Pain Assessment: Faces ?Pain  Score: 6  ?Faces Pain Scale: Hurts even more ?Pain Location: R LE ?Pain Descriptors / Indicators: Aching, Constant,  Shooting, Discomfort ?Pain Intervention(s): Limited activity within patient's tolerance, Monitored during session, Premedicated before session, Repositioned  ? ? ? ?Hand Dominance Right ?  ?Extremity/Trunk Assessment Upper Extremity Assessment ?Upper Extremity Assessment: Overall WFL for tasks assessed ?  ?Lower Extremity Assessment ?Lower Extremity Assessment: Defer to PT evaluation ?  ?Cervical / Trunk Assessment ?Cervical / Trunk Assessment: Normal ?  ?Communication Communication ?Communication: No difficulties ?  ?Cognition Arousal/Alertness: Awake/alert ?[facility]: [facility] for tasks assessed/performed ?Overall Cognitive Status: Within Functional Limits for tasks assessed ?  ?  ?  ?  ?  ?  ?  ?  ?  ?  ?  ?  ?  ?  ?  ?  ?  ?  ?  ?General Comments  VSS on RA ? ?  ?Exercises   ?  ?Shoulder Instructions    ? ? ?Home Living Family/patient expects to be discharged to:: Private residence ?Living Arrangements: Other relatives (brother and mom) ?Available Help at Discharge: Family;Available 24 hours/day ?Type of Home: House ?Home Access: Stairs to enter ?Entrance Stairs-Number of Steps: 1 ?Entrance Stairs-Rails: None ?Home Layout: One level ?  ?  ?Bathroom Shower/Tub: Psychologist, counsellingWalk-in shower;Door ?  ?Bathroom Toilet: Standard ?Bathroom Accessibility: No ?  ?Home Equipment: Rollator (4 wheels);Shower seat;Grab bars - tub/shower ?  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  ? ?  ?Prior Functioning/Environment Prior Level of Function : Independent/Modified Independent ?  ?  ?  ?  ?  ?  ?Mobility Comments: does not use AD ?ADLs Comments: takes care of mother, brother helps a lot with IADLs ?  ? ?  ?  ?OT Problem List: Decreased strength;Decreased range of motion;Decreased activity tolerance;Impaired balance (sitting and/or standing);Decreased knowledge of use of DME or AE ?  ?   ?OT Treatment/Interventions: Self-care/ADL training;Therapeutic exercise;DME and/or AE instruction;Therapeutic activities  ?  ?OT  Goals(Current goals can be found in the care plan section) Acute Rehab OT Goals ?Patient Stated Goal: to go home ?OT Goal Formulation: With patient ?Time For Goal Achievement: 11/03/21 ?Potential to Achieve Goals: Good ?ADL Goals ?Pt Will Perform Upper Body Dressing: Independently;sitting ?Pt Will Perform Lower Body Dressing: with adaptive equipment;sit to/from stand;sitting/lateral leans;with supervision ?Pt Will Transfer to Toilet: with modified independence;ambulating;regular height toilet ?Pt Will Perform Tub/Shower Transfer: Tub transfer;Shower transfer;rolling walker;shower seat;ambulating  ?OT Frequency: Min 2X/week ?  ? ?Co-evaluation   ?  ?  ?  ?  ? ?  ?AM-PAC OT "6 Clicks" Daily Activity     ?Outcome Measure Help from another person eating meals?: None ?Help from another person taking care of personal grooming?: None ?Help from another person toileting, which includes using toliet, bedpan, or urinal?: A Little ?Help from another person bathing (including washing, rinsing, drying)?: A Lot ?Help from another person to put on and taking off regular upper body clothing?: None ?Help from another person to put on and taking off regular lower body clothing?: A Lot ?6 Click Score: 19 ?  ?End of Session   ? ?Activity Tolerance:   ?Patient left:   ? ?OT Visit Diagnosis: Unsteadiness on feet (R26.81);Other abnormalities of gait and mobility (R26.89);Muscle weakness (generalized) (M62.81);History of falling (Z91.81)  ?              ?Time: [id number] ?OT Time Calculation (min): 29 min ?Charges:  OT General Charges ?$OT Visit: 1 Visit ?OT Evaluation ?$OT  Eval Low Complexity: 1 Low ?OT Treatments ?$Self Care/Home Management : 8-22 mins ? ?Alfonzo Beers, OTD, OTR/L ?Acute Rehab ?[phone] ? ?Mayer Masker ?10/20/2021, 10:32 AM ?

## 2021-10-20 NOTE — TOC Transition Note (Signed)
Transition of Care (TOC) - CM/SW Discharge Note ? ? ?Patient Details  ?Name: Veronica Cortez ?MRN: [id number] ?Date of Birth: 06/11/1971 ? ?Transition of Care (TOC) CM/SW Contact:  ?Bess Kinds, RN ?Phone Number: [phone] ?10/20/2021, 2:44 PM ? ? ?Clinical Narrative:    ? ?Spoke with patient at the bedside to discuss post acute transition. DME 3/1 and RW orders sent to [facility] for delivery to the room. Patient complaints of pain 9/10 but has no IV access. Spoke with MD to request 1 time order for oxycodone. Discussed HH recommendations. Offered choice. Referral accepted by [facility] for PT/OT. Family to provide transport home. No further TOC needs identified.  ? ?Final next level of care: Home w Home Health Services ?Barriers to Discharge: No Barriers Identified ? ? ?Patient Goals and CMS Choice ?Patient states their goals for this hospitalization and ongoing recovery are:: return home ?CMS Medicare.gov Compare Post Acute Care list provided to:: Patient ?Choice offered to / list presented to : Patient ? ?Discharge Placement ?  ?           ?  ?  ?  ?  ? ?Discharge Plan and Services ?  ?  ?           ?DME Arranged: 3-N-1, Walker rolling ?DME Agency: [facility] ?Date DME Agency Contacted: 10/20/21 ?Time DME Agency Contacted: 1440 ?Representative spoke with at DME Agency: Velna Hatchet ?HH Arranged: PT, OT ?HH Agency: [facility] ?Date HH Agency Contacted: 10/20/21 ?Time HH Agency Contacted: 1444 ?Representative spoke with at [facility] Agency: Candise Bowens ? ?Social Determinants of Health (SDOH) Interventions ?  ? ? ?Readmission Risk Interventions ?No flowsheet data found. ? ? ? ? ?

## 2021-10-20 NOTE — Progress Notes (Signed)
? ?  Subjective: ? ?Significant pain over night. Was able to work with PT to mobilize. ? ?Objective:  ? ?VITALS:   ?Vitals:  ? 10/19/21 1409 10/19/21 1947 10/19/21 2344 10/20/21 0444  ?BP: 117/70 119/73 126/71 104/67  ?Pulse: 85 71 73 72  ?Resp: 20 14  14   ?Temp: 98 ?F (36.7 ?C) 97.9 ?F (36.6 ?C) 97.6 ?F (36.4 ?C) 97.7 ?F (36.5 ?C)  ?TempSrc: Oral Oral Oral Oral  ?SpO2: 99% 95% 97% 98%  ?Weight:      ?Height:      ? ? ?Right lower extremity: Dressing is clean dry intact.  She is able to extend and flex the right knee.  She able to flex and extend great toe as well as lesser toes.  2+ dorsalis pedis pulse. ? ?Lab Results  ?Component Value Date  ? WBC 8.4 10/20/2021  ? HGB 10.4 (L) 10/20/2021  ? HCT 32.8 (L) 10/20/2021  ? MCV 84.5 10/20/2021  ? PLT 211 10/20/2021  ? ? ? ?Assessment/Plan: ? ?2 Days Post-Op status post right hip screw fixation, significant pain overnight  ? ?- Expected postop acute blood loss anemia - will monitor for symptoms ?- Patient to work with PT/OT to optimize mobilization safely ?- DVT ppx - SCDs, ambulation, aspirin 325 ?- WBAT operative extremity ?- Pain control - multimodal pain management, ATC acetaminophen in conjunction with as needed narcotic (oxycodone), although this should be minimized with other modalities  ? ? ?10/22/2021 ?10/20/2021, 7:11 AM ? ?

## 2021-10-20 NOTE — Plan of Care (Signed)
?  Problem: Education: ?Goal: Knowledge of General Education information will improve ?Description: Including pain rating scale, medication(s)/side effects and non-pharmacologic comfort measures ?Outcome: Progressing ?  ?Problem: Health Behavior/Discharge Planning: ?Goal: Ability to manage health-related needs will improve ?Outcome: Progressing ?  ?Problem: Clinical Measurements: ?Goal: Ability to maintain clinical measurements within normal limits will improve ?Outcome: Progressing ?Goal: Will remain free from infection ?Outcome: Progressing ?Goal: Diagnostic test results will improve ?Outcome: Progressing ?Goal: Respiratory complications will improve ?Outcome: Progressing ?Goal: Cardiovascular complication will be avoided ?Outcome: Progressing ?  ?Problem: Activity: ?Goal: Risk for activity intolerance will decrease ?Outcome: Progressing ?  ?Problem: Nutrition: ?Goal: Adequate nutrition will be maintained ?Outcome: Progressing ?  ?Problem: Coping: ?Goal: Level of anxiety will decrease ?Outcome: Progressing ?  ?Problem: Elimination: ?Goal: Will not experience complications related to bowel motility ?Outcome: Progressing ?Goal: Will not experience complications related to urinary retention ?Outcome: Progressing ?  ?Problem: Safety: ?Goal: Ability to remain free from injury will improve ?Outcome: Progressing ?  ?Problem: Skin Integrity: ?Goal: Risk for impaired skin integrity will decrease ?Outcome: Progressing ?  ?Problem: Education: ?Goal: Verbalization of understanding the information provided (i.e., activity precautions, restrictions, etc) will improve ?Outcome: Progressing ?Goal: Individualized Educational Video(s) ?Outcome: Progressing ?  ?Problem: Activity: ?Goal: Ability to ambulate and perform ADLs will improve ?Outcome: Progressing ?  ?Problem: Clinical Measurements: ?Goal: Postoperative complications will be avoided or minimized ?Outcome: Progressing ?  ?Problem: Self-Concept: ?Goal: Ability to maintain and  perform role responsibilities to the fullest extent possible will improve ?Outcome: Progressing ?  ?Problem: Pain Management: ?Goal: Pain level will decrease ?Outcome: Progressing ?  ?

## 2021-10-20 NOTE — Progress Notes (Addendum)
Inpatient Diabetes Program Recommendations ? ?AACE/ADA: New Consensus Statement on Inpatient Glycemic Control (2015) ? ?Target Ranges:  Prepandial:   less than 140 mg/dL ?     Peak postprandial:   less than 180 mg/dL (1-2 hours) ?     Critically ill patients:  140 - 180 mg/dL  ? ?Lab Results  ?Component Value Date  ? GLUCAP 239 (H) 10/20/2021  ? HGBA1C 9.4 (H) 10/19/2021  ? ? ?Review of Glycemic Control ? Latest Reference Range & Units 10/19/21 08:30 10/19/21 12:12 10/19/21 18:52 10/19/21 19:44 10/19/21 23:54 10/20/21 07:23  ?Glucose-Capillary 70 - 99 mg/dL 494 (H) 496 (H) 759 (H) 305 (H) 156 (H) 239 (H)  ? ?Diabetes history: DM 2 ?Outpatient Diabetes medications: Januvia 100 mg Daily, Novolog 10-16 units tid, Lantus 30 units qhs ?Current orders for Inpatient glycemic control:  ?Semglee 15 units Daily ?Novolog 0-15 units tid ? ?A1c 9.4% on 3/12 ? ?Inpatient Diabetes Program Recommendations:   ? ?Note: decadron 5 mg given yest ? ?-  consider increasing Semglee to 20 units ? ?Spoke with pt at bedside regarding A1c level of 9.4% and glucose control at home. Pt reports being to her PCP this past Friday and her A1c at that time was 8.6%. Pt reports frequent follow up. Pt also relays that her glucose trends are never in the 200 range. Fasting glucose 130's and her postprandials usually do not get above 160-170. Pt reports last year her A1c was around a 10%. Pt had decadron this admission, however prior to dose pts glucose was 332. Encouraged follow up with PCP. ? ?Thanks, ? ?Christena Deem RN, MSN, BC-ADM ?Inpatient Diabetes Coordinator ?Team Pager [phone] (8a-5p) ? ? ?

## 2021-10-20 NOTE — Progress Notes (Signed)
Spoke in depth with Dr. Clent Ridges in regards to patients patient control over night. Patient reports during the daytime her pain was uncontrolled by Morphine 2mg , per pt "I've been in pain all day. The Morphine hasn't touched my pain at all." RN attempted to relieve patient's pain by giving patient Morphine 4mg  at approximately 2300. Patient reported minimal relief, Tylenol given with minimal relief. Prior to Morphine 4mg  pt was 10/10; after 8/10. Prior to Tylenol 8/10, after 8/10. ? ?A one time dose of Oxycodone was given and patient reported a pain score of 7/10 after ambulating to bathroom. Noted her pain was decreasing despite ambulation. Will continue to monitor pain levels and administer Morphine prn.  ?

## 2021-10-20 NOTE — Discharge Summary (Signed)
Family Medicine Teaching Service ?Hospital Discharge Summary ? ?Patient name: Veronica Cortez Medical record number: [id number] ?Date of birth: 08/13/1970 Age: 51 y.o. Gender: female ?Date of Admission: 10/18/2021  Date of Discharge: 10/20/21 ?Admitting Physician: Veronica PopeAndrew Yamila Cragin, MD ? ?Primary Care Provider: Ellyn HackShah, Syed Asad A, MD ?Consultants: Orthopedic Surgery ? ?Indication for Hospitalization: Right hip fracture ? ?Discharge Diagnoses/Problem List:  ?Right Femoral neck fracture s/p surgical pinning ?T2DM ?Migraines ?MDD ?IBS ?HLD ?GERD ?COPD ?Insomnia ? ?Disposition: home with home health ? ?Discharge Condition: stable ? ?Discharge Exam:  ?Physical Exam ?Constitutional:   ?   Appearance: Normal appearance. She is normal weight.  ?HENT:  ?   Head: Normocephalic and atraumatic.  ?Cardiovascular:  ?   Rate and Rhythm: Normal rate and regular rhythm.  ?   Pulses: Normal pulses.  ?   Heart sounds: Normal heart sounds.  ?Pulmonary:  ?   Effort: Pulmonary effort is normal.  ?   Breath sounds: Normal breath sounds.  ?Abdominal:  ?   General: Abdomen is flat. Bowel sounds are normal.  ?   Palpations: Abdomen is soft.  ?Musculoskeletal:  ?   Cervical back: No rigidity or tenderness.  ? ? ?Brief Hospital Course:  ?Patient is Cortez 51 year old female presenting with right femoral neck fracture after Cortez fall onto her right hip. ? ?Subcapital Right Femoral Neck fracture ?Patient received IV morphine as needed.  Orthopedics consulted and performed cannulated right hip pinning with 3 screws as surgical intervention. Pain was well controlled following the procedure. ? ?Type 2 DM ?Patient was initially started on sensitive SSI due to n.p.o. status.  Patient was started on long-acting insulin once she was placed back on diet. Patient will be discharged on home medication.  ? ?Remainder of patient's chronic conditions including migraines, depression, and hyperlipidemia were well controlled with resuming of home meds. ? ?Follow up PCP  recommendations: ?-Assess right hip pain and mobility ? ? ? ?Significant Procedures: Right hip pinning ? ?Significant Labs and Imaging:  ?Recent Labs  ?Lab 10/18/21 ?1257 10/18/21 ?1420 10/19/21 ?0040 10/20/21 ?0406  ?WBC 12.4*  --  12.1* 8.4  ?HGB 12.5 11.9* 11.6* 10.4*  ?HCT 39.2 35.0* 36.4 32.8*  ?PLT 258  --  242 211  ? ?Recent Labs  ?Lab 10/18/21 ?1257 10/18/21 ?1420 10/19/21 ?[id number] 10/20/21 ?0406  ?NA 133* 139 131* 138  ?K 3.7 3.5 3.8 4.1  ?CL 103  --  104 110  ?CO2 17*  --  18* 21*  ?GLUCOSE 332*  --  299* 322*  ?BUN 13  --  11 10  ?CREATININE 0.72  --  0.71 0.61  ?CALCIUM 9.2  --  8.2* 8.3*  ? ? ? ? ?Results/Tests Pending at Time of Discharge: none ? ?Discharge Medications:  ?Allergies as of 10/20/2021   ? ?   Reactions  ? Acetazolamide Other (See Comments)  ? unknown  ? Benadryl [diphenhydramine] Other (See Comments)  ? unknown  ? Clarithromycin Other (See Comments)  ? unknown  ? Compazine [prochlorperazine] Other (See Comments)  ? unknown  ? Fentanyl Other (See Comments)  ? unknown  ? Lamictal [lamotrigine] Other (See Comments)  ? unknown  ? Other Other (See Comments)  ? Triptans and statins; unknown  ? Reglan [metoclopramide] Other (See Comments)  ? unknown  ? Topiramate Other (See Comments)  ? unknown  ? ?  ? ?  ?Medication List  ?  ? ?TAKE these medications   ? ?Advair HFA 115-21 MCG/ACT inhaler ?Generic drug: fluticasone-salmeterol ?Inhale  2 puffs into the lungs 2 (two) times daily. ?  ?Ajovy 225 MG/1.5ML Soaj ?Generic drug: Fremanezumab-vfrm ?Inject 1.5 mLs into the skin every 30 (thirty) days. ?  ?albuterol 108 (90 Base) MCG/ACT inhaler ?Commonly known as: VENTOLIN HFA ?Inhale 2 puffs into the lungs every 4 (four) hours as needed for wheezing or shortness of breath. ?  ?amitriptyline 100 MG tablet ?Commonly known as: ELAVIL ?Take 100 mg by mouth at bedtime. ?  ?aspirin-acetaminophen-caffeine 250-250-65 MG tablet ?Commonly known as: EXCEDRIN MIGRAINE ?Take 2 tablets by mouth every 6 (six) hours as  needed for headache. ?  ?atenolol 25 MG tablet ?Commonly known as: TENORMIN ?Take 25 mg by mouth daily. ?  ?B2 PO ?Take 1 tablet by mouth daily. ?  ?dicyclomine 10 MG capsule ?Commonly known as: BENTYL ?Take 1 capsule (10 mg total) by mouth 3 (three) times daily before meals. ?  ?enoxaparin 40 MG/0.4ML injection ?Commonly known as: LOVENOX ?Inject 0.4 mLs (40 mg total) into the skin daily for 14 days. ?  ?FISH OIL PO ?Take 1 capsule by mouth daily. ?  ?FLUoxetine 40 MG capsule ?Commonly known as: PROZAC ?Take 40 mg by mouth daily. ?  ?Insulin Aspart FlexPen 100 UNIT/ML ?Commonly known as: NOVOLOG ?Inject 10-16 Units into the skin 3 (three) times daily. ?  ?Januvia 100 MG tablet ?Generic drug: sitaGLIPtin ?Take 100 mg by mouth daily. ?  ?Lantus SoloStar 100 UNIT/ML Solostar Pen ?Generic drug: insulin glargine ?Inject 30 Units into the skin at bedtime. ?  ?Melatonin 10 MG Tabs ?Take 10 mg by mouth at bedtime. ?  ?montelukast 10 MG tablet ?Commonly known as: SINGULAIR ?Take 10 mg by mouth daily. ?  ?omeprazole 20 MG capsule ?Commonly known as: PRILOSEC ?Take 20 mg by mouth 2 (two) times daily. ?  ?oxyCODONE 5 MG immediate release tablet ?Commonly known as: Roxicodone ?Take 1 tablet (5 mg total) by mouth every 8 (eight) hours as needed for up to 7 days for severe pain. ?  ?PROBIOTIC PO ?Take 1 capsule by mouth daily. ?  ?promethazine 25 MG tablet ?Commonly known as: PHENERGAN ?Take 25 mg by mouth daily as needed for nausea/vomiting. ?  ?rosuvastatin 10 MG tablet ?Commonly known as: CRESTOR ?Take 10 mg by mouth at bedtime. ?  ?topiramate 200 MG tablet ?Commonly known as: TOPAMAX ?Take 200 mg by mouth 2 (two) times daily. ?  ?Ubrelvy 100 MG Tabs ?Generic drug: Ubrogepant ?Take 100 mg by mouth 2 (two) times daily as needed for migraine. ?  ?VITAMIN E PO ?Take 1 tablet by mouth daily. ?  ? ?  ? ?  ?  ? ? ?  ?Durable Medical Equipment  ?(From admission, onward)  ?  ? ? ?  ? ?  Start     Ordered  ? 10/20/21 0000  DME Bedside  commode       ?Question:  Patient needs Cortez bedside commode to treat with the following condition  Answer:  S/P right hip fracture  ? 10/20/21 1124  ? 10/20/21 0000  For home use only DME Walker rolling       ?Comments: 2-wheel rolling walker  ?Question Answer Comment  ?Walker: With 5 Inch Wheels   ?Patient needs Cortez walker to treat with the following condition Hip fracture (HCC)   ?  ? 10/20/21 1124  ? ?  ?  ? ?  ? ? ?Discharge Instructions: Please refer to Patient Instructions section of EMR for full details.  Patient was counseled important signs and symptoms  that should prompt return to medical care, changes in medications, dietary instructions, activity restrictions, and follow up appointments.  ? ?Follow-Up Appointments: ? Follow-up Information   ? ? [name], MD Follow up.   ?Specialty: Orthopedic Surgery ?Contact information: ?[address] ?[address] ?[place] [address] ?[phone] ? ? ?  ?  ? ?  ?  ? ?  ? ? ?Veronica Pope, MD ?10/20/2021, 12:03 PM ?PGY-1, [facility] ?

## 2021-10-20 NOTE — Progress Notes (Signed)
Physical Therapy Treatment ?Patient Details ?Name: Veronica Cortez ?MRN: [id number] ?DOB: [birth year] ?Today's Date: 10/20/2021 ? ? ?History of Present Illness 51 y.o. female with Rt hip fracture after mechanical fall, s/p Rt cannulated screw placement. PMH is significant for T2DM, diabetic neuropathy, GERD, IBD, asthma, migraines, and depression. ? ?  ?PT Comments  ? ? Pt received OOB in chair with good progress towards goals. Pt requiring up to min assist, for cueing for LE sequencing, to ascend/descend one step out of/into room to simulate single step into home. Pt with no LOB and able to recall sequencing with task practice. Pt requiring min guard for ambulation in room/hall for safety. Continued education re; ice, HEP, increasing activity tolerance and pacing. Pt verbalized understanding.  Pt continues to benefit from skilled PT services to progress toward functional mobility goals. Anticipate safe pt discharge, with family assistance, once medically cleared, will follow acutely. ?  ?Recommendations for follow up therapy are one component of a multi-disciplinary discharge planning process, led by the attending physician.  Recommendations may be updated based on patient status, additional functional criteria and insurance authorization. ? ?Follow Up Recommendations ? Home health PT ?  ?  ?Assistance Recommended at Discharge PRN  ?Patient can return home with the following Assistance with cooking/housework;Assist for transportation;Help with stairs or ramp for entrance ?  ?Equipment Recommendations ? Rolling walker (2 wheels);BSC/3in1  ?  ?Recommendations for Other Services   ? ? ?  ?Precautions / Restrictions Precautions ?Precautions: Fall ?Restrictions ?Weight Bearing Restrictions: Yes ?RLE Weight Bearing: Weight bearing as tolerated  ?  ? ?Mobility ? Bed Mobility ?  ?  ?  ?  ?  ?  ?  ?General bed mobility comments: OOB on arrival ?  ? ?Transfers ?Overall transfer level: Needs assistance ?Equipment used:  Rolling walker (2 wheels) ?Transfers: Sit to/from Stand, Bed to chair/wheelchair/BSC ?Sit to Stand: Supervision, Min assist (light min assist to stand from toilet) ?  ?  ?  ?  ?  ?  ?  ? ?Ambulation/Gait ?Ambulation/Gait assistance: Min guard ?Gait Distance (Feet): 50 Feet ?Assistive device: Rolling walker (2 wheels) ?Gait Pattern/deviations: Step-to pattern, Decreased stance time - right, Decreased stride length, Antalgic ?Gait velocity: decreased ?  ?  ?General Gait Details: Ambulating in room with close guard for safety. Educated on AD use with rolling walker. Cues for sequencing and UE support as needed. Tolerated short distance in room this date. ? ? ?Stairs ?Stairs: Yes ?Stairs assistance: Min guard, Min assist (min A for cueing) ?Stair Management: No rails, Forwards, With walker ?Number of Stairs: 2 ?General stair comments: up and over curb step out/into room ? ? ?Wheelchair Mobility ?  ? ?Modified Rankin (Stroke Patients Only) ?  ? ? ?  ?Balance Overall balance assessment: Mild deficits observed, not formally tested ?  ?  ?  ?  ?  ?  ?  ?  ?  ?  ?  ?  ?  ?  ?  ?  ?  ?  ?  ? ?  ?Cognition Arousal/Alertness: Awake/alert ?Behavior During Therapy: [facility] for tasks assessed/performed ?Overall Cognitive Status: Within Functional Limits for tasks assessed ?  ?  ?  ?  ?  ?  ?  ?  ?  ?  ?  ?  ?  ?  ?  ?  ?  ?  ?  ? ?  ?Exercises General Exercises - Lower Extremity ?Ankle Circles/Pumps: AROM, Both, 10 reps, Seated ? ?  ?General Comments   ?  ?  ? ?  Pertinent Vitals/Pain Pain Assessment ?Pain Assessment: Faces ?Faces Pain Scale: Hurts little more ?Pain Location: R LE ?Pain Descriptors / Indicators: Aching, Discomfort ?Pain Intervention(s): Limited activity within patient's tolerance, Monitored during session, Repositioned, Ice applied  ? ? ?Home Living Family/patient expects to be discharged to:: Private residence ?Living Arrangements: Other relatives (brother and mom) ?Available Help at Discharge: Family;Available 24  hours/day ?Type of Home: House ?Home Access: Stairs to enter ?Entrance Stairs-Rails: None ?Entrance Stairs-Number of Steps: 1 ?  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  ?Prior Function    ?  ?  ?   ? ?PT Goals (current goals can now be found in the care plan section) Acute Rehab PT Goals ?Patient Stated Goal: Return home ?PT Goal Formulation: With patient ?Time For Goal Achievement: 11/02/21 ?Potential to Achieve Goals: Good ? ?  ?Frequency ? ? ? Min 3X/week ? ? ? ?  ?PT Plan    ? ? ?Co-evaluation   ?  ?  ?  ?  ? ?  ?AM-PAC PT "6 Clicks" Mobility   ?Outcome Measure ? Help needed turning from your back to your side while in a flat bed without using bedrails?: None ?Help needed moving from lying on your back to sitting on the side of a flat bed without using bedrails?: None ?Help needed moving to and from a bed to a chair (including a wheelchair)?: A Little ?Help needed standing up from a chair using your arms (e.g., wheelchair or bedside chair)?: A Little ?Help needed to walk in hospital room?: A Little ?Help needed climbing 3-5 steps with a railing? : A Lot (cues only) ?6 Click Score: 19 ? ?  ?End of Session Equipment Utilized During Treatment: Gait belt ?Activity Tolerance: Patient tolerated treatment well ?Patient left: in chair;with call bell/phone within reach ?Nurse Communication: Mobility status ?PT Visit Diagnosis: Unsteadiness on feet (R26.81);Other abnormalities of gait and mobility (R26.89);History of falling (Z91.81);Difficulty in walking, not elsewhere classified (R26.2);Pain ?Pain - Right/Left: Right ?Pain - part of body: Hip ?  ? ? ?Time: 5732-2025 ?PT Time Calculation (min) (ACUTE ONLY): 20 min ? ?Charges:  $Gait Training: 8-22 mins          ?          ? ?Lenora Boys. PTA ?Acute Rehabilitation Services ?Office: [phone] ? ? ? ?Marlana Salvage Vanesa Renier ?10/20/2021, 9:58 AM ? ?

## 2021-10-20 NOTE — Progress Notes (Signed)
?  Progress Note  ? ?Date: 10/20/2021 ? ?Patient Name: Veronica Cortez        ?MRN#: [id number] ? ?Review the patient?s clinical findings supports the diagnosis of:  ? ?Obesity   ? ? ? ? ?

## 2021-10-21 NOTE — TOC CAGE-AID Note (Signed)
Transition of Care (TOC) - CAGE-AID Screening ? ? ?Patient Details  ?Name: Veronica Cortez ?MRN: [id number] ?Date of Birth: [birth year] ? ?Transition of Care (TOC) CM/SW Contact:    ?Little Winton C Tarpley-Carter, LCSWA ?Phone Number: ?10/21/2021, 10:49 AM ? ? ?Clinical Narrative: ?Pt participated in Cage-Aid.  Pt stated she does drink ETOH socially.  Pt was offered resources, due to usage of ETOH.    ? ?Insurance underwriter, MSW, LCSW-A ?Pronouns:  She/Her/Hers ?[facility] ?Clinical Social Worker ?Direct Number:  [phone] ?[email or web address] ? ?CAGE-AID Screening: ?  ? ?Have You Ever Felt You Ought to Cut Down on Your Drinking or Drug Use?: No ?Have People Annoyed You By Critizing Your Drinking Or Drug Use?: No ?Have You Felt Bad Or Guilty About Your Drinking Or Drug Use?: No ?Have You Ever Had a Drink or Used Drugs First Thing In The Morning to Steady Your Nerves or to Get Rid of a Hangover?: No ?CAGE-AID Score: 0 ? ?Substance Abuse Education Offered: Yes ? ?Substance abuse interventions: Educational Materials ? ? ? ? ? ? ?

## 2021-10-22 ENCOUNTER — Encounter (HOSPITAL_COMMUNITY): Payer: Self-pay | Admitting: Orthopaedic Surgery

## 2021-10-23 ENCOUNTER — Other Ambulatory Visit (INDEPENDENT_AMBULATORY_CARE_PROVIDER_SITE_OTHER): Payer: Self-pay | Admitting: Family Medicine

## 2021-10-23 ENCOUNTER — Encounter (HOSPITAL_BASED_OUTPATIENT_CLINIC_OR_DEPARTMENT_OTHER): Payer: Self-pay | Admitting: Orthopaedic Surgery

## 2021-11-04 ENCOUNTER — Other Ambulatory Visit (HOSPITAL_BASED_OUTPATIENT_CLINIC_OR_DEPARTMENT_OTHER): Payer: Self-pay | Admitting: Orthopaedic Surgery

## 2021-11-04 DIAGNOSIS — S72011D Unspecified intracapsular fracture of right femur, subsequent encounter for closed fracture with routine healing: Secondary | ICD-10-CM

## 2021-11-05 ENCOUNTER — Encounter (HOSPITAL_BASED_OUTPATIENT_CLINIC_OR_DEPARTMENT_OTHER): Payer: Medicare PPO | Admitting: Orthopaedic Surgery

## 2021-11-06 ENCOUNTER — Other Ambulatory Visit (HOSPITAL_BASED_OUTPATIENT_CLINIC_OR_DEPARTMENT_OTHER): Payer: Self-pay | Admitting: Orthopaedic Surgery

## 2021-11-06 ENCOUNTER — Ambulatory Visit (HOSPITAL_BASED_OUTPATIENT_CLINIC_OR_DEPARTMENT_OTHER)
Admission: RE | Admit: 2021-11-06 | Discharge: 2021-11-06 | Disposition: A | Discharge status: Home / Self Care | Payer: Medicare PPO | Source: Ambulatory Visit | Attending: Orthopaedic Surgery | Admitting: Orthopaedic Surgery

## 2021-11-06 ENCOUNTER — Ambulatory Visit (INDEPENDENT_AMBULATORY_CARE_PROVIDER_SITE_OTHER): Payer: Medicare PPO | Admitting: Orthopaedic Surgery

## 2021-11-06 ENCOUNTER — Other Ambulatory Visit (HOSPITAL_BASED_OUTPATIENT_CLINIC_OR_DEPARTMENT_OTHER): Payer: Self-pay

## 2021-11-06 DIAGNOSIS — S72011D Unspecified intracapsular fracture of right femur, subsequent encounter for closed fracture with routine healing: Secondary | ICD-10-CM

## 2021-11-06 MED ORDER — HYDROCODONE-ACETAMINOPHEN 5-325 MG PO TABS
1.0000 | ORAL_TABLET | Freq: Four times a day (QID) | ORAL | 0 refills | Status: DC | PRN
  Filled 2021-11-06: qty 30, 8d supply, fill #0

## 2021-11-06 NOTE — Progress Notes (Signed)
? ?                            ? ? ?Post Operative Evaluation ?  ? ?Procedure/Date of Surgery: 11/06/21 right hip percutaneous pinning ? ?Interval History:  ? ?Presents today 2 weeks status post the above procedure.  Overall she is doing very well.  She has been compliant with aspirin usage.  She has been taking care of her mom and is needing to get her in and out of the toilet.  She has worked significantly to help her mother but this is caused significant pain in the hip.  She is currently walking with the assistance of a walker. ? ? ?PMH/PSH/Family History/Social History/Meds/Allergies:   ? ?Past Medical History:  ?Diagnosis Date  ? Asthma   ? Depression   ? Diabetes mellitus without complication (HCC)   ? Fibromyalgia   ? GERD (gastroesophageal reflux disease)   ? Hypertension   ? IBD (inflammatory bowel disease)   ? Migraine   ? Neuropathy   ? Sleep apnea   ? ?Past Surgical History:  ?Procedure Laterality Date  ? ABDOMINAL HYSTERECTOMY    ? BACK SURGERY    ? ENDOSCOPIC PLANTAR FASCIOTOMY    ? HAND SURGERY    ? HIP PINNING,CANNULATED Right 10/18/2021  ? Procedure: CANNULATED HIP PINNING;  Surgeon: [name], MD;  Location: [facility] OR;  Service: Orthopedics;  Laterality: Right;  ? TUBAL LIGATION    ? ?Social History  ? ?Socioeconomic History  ? Marital status: Single  ?  Spouse name: Not on file  ? Number of children: Not on file  ? Years of education: Not on file  ? Highest education level: Not on file  ?Occupational History  ? Not on file  ?Tobacco Use  ? Smoking status: Former  ?  Types: Cigarettes  ?  Quit date: 2012  ?  Years since quitting: 11.2  ? Smokeless tobacco: Never  ?Vaping Use  ? Vaping Use: Never used  ?Substance and Sexual Activity  ? Alcohol use: Yes  ?  Alcohol/week: 2.0 standard drinks  ?  Types: 2 Glasses of wine per week  ? Drug use: Never  ? Sexual activity: Not on file  ?Other Topics Concern  ? Not on file  ?Social History Narrative  ? Not on file  ? ?Social Determinants of Health   ? ?Financial Resource Strain: Not on file  ?Food Insecurity: Not on file  ?Transportation Needs: Not on file  ?Physical Activity: Not on file  ?Stress: Not on file  ?Social Connections: Not on file  ? ?Family History  ?Problem Relation Age of Onset  ? Breast cancer Mother   ? Kidney failure Father   ? ?Allergies  ?Allergen Reactions  ? Acetazolamide Other (See Comments)  ?  unknown  ? Benadryl [Diphenhydramine] Other (See Comments)  ?  unknown  ? Clarithromycin Other (See Comments)  ?  unknown  ? Compazine [Prochlorperazine] Other (See Comments)  ?  unknown  ? Fentanyl Other (See Comments)  ?  unknown  ? Lamictal [Lamotrigine] Other (See Comments)  ?  unknown  ? Other Other (See Comments)  ?  Triptans and statins; unknown  ? Reglan [Metoclopramide] Other (See Comments)  ?  unknown  ? Topiramate Other (See Comments)  ?  unknown  ? ?Current Outpatient Medications  ?Medication Sig Dispense Refill  ? ADVAIR HFA 115-21 MCG/ACT inhaler Inhale 2 puffs into the lungs 2 (two)  times daily.    ? AJOVY 225 MG/1.5ML SOAJ Inject 1.5 mLs into the skin every 30 (thirty) days.    ? albuterol (VENTOLIN HFA) 108 (90 Base) MCG/ACT inhaler Inhale 2 puffs into the lungs every 4 (four) hours as needed for wheezing or shortness of breath.    ? amitriptyline (ELAVIL) 100 MG tablet Take 100 mg by mouth at bedtime.    ? aspirin-acetaminophen-caffeine (EXCEDRIN MIGRAINE) 250-250-65 MG tablet Take 2 tablets by mouth every 6 (six) hours as needed for headache.    ? atenolol (TENORMIN) 25 MG tablet Take 25 mg by mouth daily.    ? dicyclomine (BENTYL) 10 MG capsule Take 1 capsule (10 mg total) by mouth 3 (three) times daily before meals. 90 capsule 0  ? enoxaparin (LOVENOX) 40 MG/0.4ML injection Inject 0.4 mLs (40 mg total) into the skin daily for 14 days. 5.6 mL 0  ? FLUoxetine (PROZAC) 40 MG capsule Take 40 mg by mouth daily.    ? Insulin Aspart FlexPen (NOVOLOG) 100 UNIT/ML Inject 10-16 Units into the skin 3 (three) times daily.    ? JANUVIA  100 MG tablet Take 100 mg by mouth daily.    ? LANTUS SOLOSTAR 100 UNIT/ML Solostar Pen Inject 30 Units into the skin at bedtime.    ? Melatonin 10 MG TABS Take 10 mg by mouth at bedtime.    ? montelukast (SINGULAIR) 10 MG tablet Take 10 mg by mouth daily.    ? Omega-3 Fatty Acids (FISH OIL PO) Take 1 capsule by mouth daily.    ? omeprazole (PRILOSEC) 20 MG capsule Take 20 mg by mouth 2 (two) times daily.    ? Probiotic Product (PROBIOTIC PO) Take 1 capsule by mouth daily.    ? promethazine (PHENERGAN) 25 MG tablet Take 25 mg by mouth daily as needed for nausea/vomiting.    ? Riboflavin (B2 PO) Take 1 tablet by mouth daily.    ? rosuvastatin (CRESTOR) 10 MG tablet Take 10 mg by mouth at bedtime.    ? topiramate (TOPAMAX) 200 MG tablet Take 200 mg by mouth 2 (two) times daily.    ? UBRELVY 100 MG TABS Take 100 mg by mouth 2 (two) times daily as needed for migraine.    ? VITAMIN E PO Take 1 tablet by mouth daily.    ? ?Current Facility-Administered Medications  ?Medication Dose Route Frequency Provider Last Rate Last Admin  ? acetaminophen (TYLENOL) tablet 1,000 mg  1,000 mg Oral Q8H Brimage, Vondra, DO      ? ?No results found. ? ?Review of Systems:   ?A ROS was performed including pertinent positives and negatives as documented in the HPI. ? ? ?Musculoskeletal Exam:   ? ?There were no vitals taken for this visit. ? ?Right lateral skin incision is well-healed.  She is able to flex at the right hip and extend at the right knee without difficulty.  She is able to walk with a mildly antalgic gait and a walker ? ?Imaging:   ? ?X-ray right hip 2 views: ?Status post percutaneous pinning without evidence of hardware failure ? ?I personally reviewed and interpreted the radiographs. ? ? ?Assessment:   ?51 year old female is 2 weeks status post right hip percutaneous pinning overall she is doing very well.  I would like her to continue to advance as tolerated.  She has home physical therapy coming to her house.  She will  continue to work on strengthening and range of motion about the right hip.  I will see her back in 4 weeks ? ?Plan :   ? ?-Return to clinic in 4 weeks ? ? ? ? ?I personally saw and evaluated the patient, and participated in the management and treatment plan. ? ?[name], MD ?Attending Physician, Orthopedic Surgery ? ?This document was dictated using Conservation officer, historic buildings. A reasonable attempt at proof reading has been made to minimize errors. ? ? ?

## 2021-12-04 ENCOUNTER — Other Ambulatory Visit (HOSPITAL_BASED_OUTPATIENT_CLINIC_OR_DEPARTMENT_OTHER): Payer: Self-pay | Admitting: Orthopaedic Surgery

## 2021-12-04 ENCOUNTER — Ambulatory Visit (HOSPITAL_BASED_OUTPATIENT_CLINIC_OR_DEPARTMENT_OTHER)
Admission: RE | Admit: 2021-12-04 | Discharge: 2021-12-04 | Disposition: A | Discharge status: Home / Self Care | Payer: Medicare PPO | Source: Ambulatory Visit | Attending: Orthopaedic Surgery | Admitting: Orthopaedic Surgery

## 2021-12-04 ENCOUNTER — Ambulatory Visit (INDEPENDENT_AMBULATORY_CARE_PROVIDER_SITE_OTHER): Payer: Medicare PPO | Admitting: Orthopaedic Surgery

## 2021-12-04 DIAGNOSIS — S72011D Unspecified intracapsular fracture of right femur, subsequent encounter for closed fracture with routine healing: Secondary | ICD-10-CM

## 2021-12-04 NOTE — Progress Notes (Signed)
? ?                            ? ? ?Post Operative Evaluation ?  ? ?Procedure/Date of Surgery: 11/06/21 right hip percutaneous pinning ? ?Interval History:  ? ?Presents today for follow-up of her right hip percutaneous pinning.  Overall while the hip does feel much better she is not experiencing knee pain and back pain as result of her altered gait.  She is continuing to use a walker.  She has been discharged from home physical therapy at today's visit.  She is here today for further assessment.  She has been recently taking care of her mother who has been very sick. ? ? ?PMH/PSH/Family History/Social History/Meds/Allergies:   ? ?Past Medical History:  ?Diagnosis Date  ? Asthma   ? Depression   ? Diabetes mellitus without complication (HCC)   ? Fibromyalgia   ? GERD (gastroesophageal reflux disease)   ? Hypertension   ? IBD (inflammatory bowel disease)   ? Migraine   ? Neuropathy   ? Sleep apnea   ? ?Past Surgical History:  ?Procedure Laterality Date  ? ABDOMINAL HYSTERECTOMY    ? BACK SURGERY    ? ENDOSCOPIC PLANTAR FASCIOTOMY    ? HAND SURGERY    ? HIP PINNING,CANNULATED Right 10/18/2021  ? Procedure: CANNULATED HIP PINNING;  Surgeon: Huel CoteBokshan, Herminio Kniskern, MD;  Location: [facility] OR;  Service: Orthopedics;  Laterality: Right;  ? TUBAL LIGATION    ? ?Social History  ? ?Socioeconomic History  ? Marital status: Single  ?  Spouse name: Not on file  ? Number of children: Not on file  ? Years of education: Not on file  ? Highest education level: Not on file  ?Occupational History  ? Not on file  ?Tobacco Use  ? Smoking status: Former  ?  Types: Cigarettes  ?  Quit date: 2012  ?  Years since quitting: 11.3  ? Smokeless tobacco: Never  ?Vaping Use  ? Vaping Use: Never used  ?Substance and Sexual Activity  ? Alcohol use: Yes  ?  Alcohol/week: 2.0 standard drinks  ?  Types: 2 Glasses of wine per week  ? Drug use: Never  ? Sexual activity: Not on file  ?Other Topics Concern  ? Not on file  ?Social History Narrative  ? Not on file   ? ?Social Determinants of Health  ? ?Financial Resource Strain: Not on file  ?Food Insecurity: Not on file  ?Transportation Needs: Not on file  ?Physical Activity: Not on file  ?Stress: Not on file  ?Social Connections: Not on file  ? ?Family History  ?Problem Relation Age of Onset  ? Breast cancer Mother   ? Kidney failure Father   ? ?Allergies  ?Allergen Reactions  ? Acetazolamide Other (See Comments)  ?  unknown  ? Benadryl [Diphenhydramine] Other (See Comments)  ?  unknown  ? Clarithromycin Other (See Comments)  ?  unknown  ? Compazine [Prochlorperazine] Other (See Comments)  ?  unknown  ? Fentanyl Other (See Comments)  ?  unknown  ? Lamictal [Lamotrigine] Other (See Comments)  ?  unknown  ? Other Other (See Comments)  ?  Triptans and statins; unknown  ? Reglan [Metoclopramide] Other (See Comments)  ?  unknown  ? Topiramate Other (See Comments)  ?  unknown  ? ?Current Outpatient Medications  ?Medication Sig Dispense Refill  ? ADVAIR HFA 115-21 MCG/ACT inhaler Inhale 2 puffs into the  lungs 2 (two) times daily.    ? AJOVY 225 MG/1.5ML SOAJ Inject 1.5 mLs into the skin every 30 (thirty) days.    ? albuterol (VENTOLIN HFA) 108 (90 Base) MCG/ACT inhaler Inhale 2 puffs into the lungs every 4 (four) hours as needed for wheezing or shortness of breath.    ? amitriptyline (ELAVIL) 100 MG tablet Take 100 mg by mouth at bedtime.    ? aspirin-acetaminophen-caffeine (EXCEDRIN MIGRAINE) 250-250-65 MG tablet Take 2 tablets by mouth every 6 (six) hours as needed for headache.    ? atenolol (TENORMIN) 25 MG tablet Take 25 mg by mouth daily.    ? dicyclomine (BENTYL) 10 MG capsule Take 1 capsule (10 mg total) by mouth 3 (three) times daily before meals. 90 capsule 0  ? enoxaparin (LOVENOX) 40 MG/0.4ML injection Inject 0.4 mLs (40 mg total) into the skin daily for 14 days. 5.6 mL 0  ? FLUoxetine (PROZAC) 40 MG capsule Take 40 mg by mouth daily.    ? HYDROcodone-acetaminophen (NORCO/VICODIN) 5-325 MG tablet Take 1 tablet by mouth  every 6 (six) hours as needed for moderate pain. 30 tablet 0  ? Insulin Aspart FlexPen (NOVOLOG) 100 UNIT/ML Inject 10-16 Units into the skin 3 (three) times daily.    ? JANUVIA 100 MG tablet Take 100 mg by mouth daily.    ? LANTUS SOLOSTAR 100 UNIT/ML Solostar Pen Inject 30 Units into the skin at bedtime.    ? Melatonin 10 MG TABS Take 10 mg by mouth at bedtime.    ? montelukast (SINGULAIR) 10 MG tablet Take 10 mg by mouth daily.    ? Omega-3 Fatty Acids (FISH OIL PO) Take 1 capsule by mouth daily.    ? omeprazole (PRILOSEC) 20 MG capsule Take 20 mg by mouth 2 (two) times daily.    ? Probiotic Product (PROBIOTIC PO) Take 1 capsule by mouth daily.    ? promethazine (PHENERGAN) 25 MG tablet Take 25 mg by mouth daily as needed for nausea/vomiting.    ? Riboflavin (B2 PO) Take 1 tablet by mouth daily.    ? rosuvastatin (CRESTOR) 10 MG tablet Take 10 mg by mouth at bedtime.    ? topiramate (TOPAMAX) 200 MG tablet Take 200 mg by mouth 2 (two) times daily.    ? UBRELVY 100 MG TABS Take 100 mg by mouth 2 (two) times daily as needed for migraine.    ? VITAMIN E PO Take 1 tablet by mouth daily.    ? ?Current Facility-Administered Medications  ?Medication Dose Route Frequency Provider Last Rate Last Admin  ? acetaminophen (TYLENOL) tablet 1,000 mg  1,000 mg Oral Q8H Brimage, Vondra, DO      ? ?No results found. ? ?Review of Systems:   ?A ROS was performed including pertinent positives and negatives as documented in the HPI. ? ? ?Musculoskeletal Exam:   ? ?There were no vitals taken for this visit. ? ?Right lateral skin incision is well-healed.  She is able to flex at the right hip and extend at the right knee without difficulty.  She is able to walk with a mildly antalgic gait and a walker ? ?Imaging:   ? ?X-ray right hip 2 views: ?Status post percutaneous pinning without evidence of hardware failure ? ?I personally reviewed and interpreted the radiographs. ? ? ?Assessment:   ?51 year old female is 6 weeks status post  percutaneous pinning of the right hip overall continuing to improve.  At this time I would like her to participate in  outpatient physical therapy for aquatic therapy and for establishment of her gait.  I will see her back in 6 weeks ? ?Plan :   ? ?-Return to clinic in 6 weeks ? ? ? ? ?I personally saw and evaluated the patient, and participated in the management and treatment plan. ? ?[name], MD ?Attending Physician, Orthopedic Surgery ? ?This document was dictated using Conservation officer, historic buildings. A reasonable attempt at proof reading has been made to minimize errors. ? ? ?

## 2021-12-16 ENCOUNTER — Ambulatory Visit (HOSPITAL_BASED_OUTPATIENT_CLINIC_OR_DEPARTMENT_OTHER): Payer: Medicare PPO | Attending: Orthopaedic Surgery | Admitting: Physical Therapy

## 2021-12-16 ENCOUNTER — Encounter (HOSPITAL_BASED_OUTPATIENT_CLINIC_OR_DEPARTMENT_OTHER): Payer: Self-pay | Admitting: Physical Therapy

## 2021-12-16 DIAGNOSIS — Z4889 Encounter for other specified surgical aftercare: Secondary | ICD-10-CM | POA: Diagnosis not present

## 2021-12-16 DIAGNOSIS — R262 Difficulty in walking, not elsewhere classified: Secondary | ICD-10-CM | POA: Diagnosis not present

## 2021-12-16 DIAGNOSIS — M25551 Pain in right hip: Secondary | ICD-10-CM | POA: Diagnosis not present

## 2021-12-16 DIAGNOSIS — Z5189 Encounter for other specified aftercare: Secondary | ICD-10-CM | POA: Diagnosis present

## 2021-12-16 DIAGNOSIS — M6281 Muscle weakness (generalized): Secondary | ICD-10-CM | POA: Diagnosis not present

## 2021-12-16 DIAGNOSIS — S72011D Unspecified intracapsular fracture of right femur, subsequent encounter for closed fracture with routine healing: Secondary | ICD-10-CM | POA: Diagnosis not present

## 2021-12-16 DIAGNOSIS — M25651 Stiffness of right hip, not elsewhere classified: Secondary | ICD-10-CM

## 2021-12-16 NOTE — Therapy (Signed)
?OUTPATIENT PHYSICAL THERAPY LOWER EXTREMITY EVALUATION ? ? ?Patient Name: Veronica Cortez ?MRN: [id number] ?DOB:1971/01/17, 51 y.o., female ?Today's Date: 12/16/2021 ? ? PT End of Session - 12/16/21 1604   ? ? Visit Number 1   ? Number of Visits 19   ? Date for PT Re-Evaluation 03/16/22   ? Authorization Type [employer] MCR   ? PT Start Time 1600   ? PT Stop Time 1645   ? PT Time Calculation (min) 45 min   ? Activity Tolerance Patient tolerated treatment well   ? Behavior During Therapy [facility] for tasks assessed/performed   ? ?  ?  ? ?  ? ? ?Past Medical History:  ?Diagnosis Date  ? Asthma   ? Depression   ? Diabetes mellitus without complication (HCC)   ? Fibromyalgia   ? GERD (gastroesophageal reflux disease)   ? Hypertension   ? IBD (inflammatory bowel disease)   ? Migraine   ? Neuropathy   ? Sleep apnea   ? ?Past Surgical History:  ?Procedure Laterality Date  ? ABDOMINAL HYSTERECTOMY    ? BACK SURGERY  2013  ? ENDOSCOPIC PLANTAR FASCIOTOMY    ? HAND SURGERY    ? HIP PINNING,CANNULATED Right 10/18/2021  ? Procedure: CANNULATED HIP PINNING;  Surgeon: [name], MD;  Location: [facility] OR;  Service: Orthopedics;  Laterality: Right;  ? TUBAL LIGATION    ? ?Patient Active Problem List  ? Diagnosis Date Noted  ? Femoral fracture (HCC) 10/18/2021  ? Closed subcapital fracture of right femur (HCC)   ? Fall   ? Hyperglycemia due to diabetes mellitus (HCC)   ? Acute hemorrhagic colitis 04/16/2021  ? Insulin-requiring or dependent type II diabetes mellitus (HCC)   ? Asthma   ? Essential hypertension   ? Depression   ? ? ?PCP: Ellyn Hack, MD ? ?REFERRING PROVIDER: [name], MD ? ?REFERRING DIAG: S72.011D (ICD-10-CM) - Closed subcapital fracture of right femur with routine healing, subsequent encounter ? ?S/p 6 week R hip pinning  ?Did HHPT but should begin hip stregthening program ? ?THERAPY DIAG:  ?Pain in right hip ? ?Stiffness of right hip, not elsewhere classified ? ?Muscle weakness (generalized) ? ?Difficulty  walking ? ?ONSET DATE: 10/18/2021 ? ? Right hip cannulated screw placement ? ?SUBJECTIVE:  ? ?SUBJECTIVE STATEMENT: ?Pt states she was walking down the hall and lost her balance and fell after she bumped the wall. She landed directly onto her R side and broke her R hip. This happened in 3/11. She takes care of her mom at home. 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 She gets pins and needles into her feet due to her DM. She has a history of unsteadiness and 2-3 other falls prior to this injury. She has some orthostatic changes that causes her lightheadedness.  ? ?Pt had the surgery done the same day. She had HHPT for 3 visits while at home. She was doing some HEP already from the hospital. She was doing basic home exercises standing at the sink. Pt states she just recently switched over to the cane a few days ago. She has been having some R knee and back pain due to the hip. She will sometimes still have "twinges" of pain in the hip but no resting pain. She is able to sleep on that side.  ? ?PERTINENT HISTORY: ?2013 discectomy L4-L5, L5-S1, DM, neuropathy, history of dizziness, migraines- ICP issues ? ?PAIN:  ?Are you having pain?  No:  ? ?PRECAUTIONS: None ? ?WEIGHT BEARING RESTRICTIONS Yes WBAT ? ?FALLS:  ?Has patient fallen in last 6 months? Yes. Number of falls 1, initial fall ? ?LIVING ENVIRONMENT: ?Lives with: lives with their family ?Lives in: House/apartment ?Stairs: No ?Has following equipment at home: Single point cane, Walker - 2 wheeled, and ManitoWalker - 4 wheeled ? 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 ?PLOF: Independent with basic ADLs ? ?PATIENT GOALS : Pt states she would like to improve her balance in her legs and be able to help her mother better at home. She would like to get back into the garden and return to yardwork.  ? ? ?OBJECTIVE:  ? ?DIAGNOSTIC FINDINGS:  ? ?IMPRESSION: ?Status post ORIF of the proximal right femur with unchanged mild ?femoral neck  displacement but no evidence of hardware failure. ?  ? ?PATIENT SURVEYS:  ?Lower Extremity Functional Score: 35 / 80 = 43.8 % ? ?COGNITION: ? Overall cognitive status: Within functional limits for tasks assessed   ?  ?SENSATION: ?Light touch: Impaired  ? ?MUSCLE LENGTH: ?WNL at HS and Quads on R ? ?POSTURE:  ?WFL ? ?PALPATION: ?No TTP about R hip; mild hypertonicity of glutes ? ?LE ROM: ? ?Active ROM Right ?12/16/2021 Left ?12/16/2021  ?Hip flexion 110 p! WNL  ?Hip extension 5 WNL  ?Hip abduction WNL WNL  ?Hip internal rotation 25 WNL  ?Hip external rotation WNL WNL  ?Knee flexion WNL WNL  ?Knee extension WNL WNL  ? (Blank rows = not tested) ? ?LE MMT: ? ?MMT Right ?12/16/2021 Left ?12/16/2021  ?Hip flexion 4/5 4+/5  ?Hip extension 4/5 4+/5  ?Hip abduction 4/5 4+/5  ?Hip adduction 4/5 5/5  ?Knee flexion 4+/5 4+/5  ?Knee extension 4+/5 5/5  ? (Blank rows = not tested) ? ? ? ?FUNCTIONAL TESTS:  ?5 times sit to stand: 17s  UE needed to push on knees ? ?GAIT: ?Distance walked: 3335ft ?Assistive device utilized: Single point cane ?Level of assistance: Complete Independence ?Comments: circumduction, decreased stance time on R, widened BOS ? ?Stairs: step to pattern with UE and SPC, VC and TC needed for sequencing and safety ? ?TODAY'S TREATMENT: ?Exercises ?- Clamshell with Resistance  - 1 x daily - 7 x weekly - 3 sets - 10 reps ?- Sit to Stand with Resistance Around Legs  - 1 x daily - 7 x weekly - 3 sets - 10 reps ?- Side Stepping with Resistance at Thighs and Counter Support  - 1 x daily - 7 x weekly - 3 sets - 10 reps ?- Standing Hip Flexor Stretch  - 2 x daily - 7 x weekly - 1 sets - 2 reps - 30 hold ? ? ?PATIENT EDUCATION:  ?Education details: MOI, diagnosis, prognosis, anatomy, exercise progression, DOMS expectations, muscle firing,  envelope of function, HEP, POC ? ?Person educated: Patient ?Education method: Explanation, Demonstration, Tactile cues, Verbal cues, and Handouts ?Education comprehension: verbalized  understanding, returned demonstration, verbal cues required, tactile cues required, and needs further education ? ? ?HOME EXERCISE PROGRAM: ?Access Code: [email or web address] ?URL: [email or web address] ?Date: 12/16/2021 ?Prepared by: Zebedee IbaAlan Ishmel Acevedo ? ? ? ?ASSESSMENT: ? ?CLINICAL IMPRESSION: ?Patient is a 51 y.o. female who was seen today for physical therapy evaluation and treatment for s/p R hip fracture with cannulated screw placement. Pt has expected gait deviations, balance deficits, minimal ROM restrictions, and LE strength deficits following injury and surgery. Pt has well managed pain. Pt to benefit from both aquatic and land based therapy for R hip  strengthening. Pt's history of dizziness and neuropathy place her at an increased risk for falls. Pt would benefit from continued skilled therapy in order to reach goals and maximize functional community mobility, strength, and ROM for return to PLOF.  ? ? ?OBJECTIVE IMPAIRMENTS Abnormal gait, decreased activity tolerance, decreased balance, decreased endurance, decreased knowledge of use of DME, decreased mobility, difficulty walking, decreased ROM, decreased strength, dizziness, hypomobility, increased fascial restrictions, increased muscle spasms, impaired flexibility, impaired sensation, improper body mechanics, postural dysfunction, and pain.  ? ?ACTIVITY LIMITATIONS cleaning, community activity, occupation, laundry, yard work, shopping, and exercise .  ? ?PERSONAL FACTORS Age, Fitness, Past/current experiences, and 3+ comorbidities:    are also affecting patient's functional outcome.  ? ? ?REHAB POTENTIAL: Good ? ?CLINICAL DECISION MAKING: Stable/uncomplicated ? ?EVALUATION COMPLEXITY: Low ? ? ?GOALS: ? ? ?SHORT TERM GOALS: Target date: 01/27/2022   ? ?Pt will become independent with HEP in order to demonstrate synthesis of PT education. ? ? ?Goal status: INITIAL ? ?2.  Pt will be able to demonstrate ability to perform STS without UE from std chair in order to  demonstrate functional improvement in LE function for self-care and house hold duties.  ? ?Goal status: INITIAL ? ?3.  Pt will be able to demonstrate/report ability to sit/stand/sleep for extended periods of time withou

## 2021-12-23 ENCOUNTER — Ambulatory Visit (HOSPITAL_BASED_OUTPATIENT_CLINIC_OR_DEPARTMENT_OTHER): Payer: Medicare PPO | Admitting: Physical Therapy

## 2021-12-25 ENCOUNTER — Ambulatory Visit (HOSPITAL_BASED_OUTPATIENT_CLINIC_OR_DEPARTMENT_OTHER): Payer: Medicare PPO | Admitting: Physical Therapy

## 2021-12-25 ENCOUNTER — Encounter: Payer: Self-pay | Admitting: Family Medicine

## 2022-01-01 ENCOUNTER — Ambulatory Visit (HOSPITAL_BASED_OUTPATIENT_CLINIC_OR_DEPARTMENT_OTHER): Payer: Medicare PPO | Admitting: Physical Therapy

## 2022-01-07 ENCOUNTER — Ambulatory Visit (HOSPITAL_BASED_OUTPATIENT_CLINIC_OR_DEPARTMENT_OTHER): Payer: Medicare PPO | Admitting: Physical Therapy

## 2022-01-13 ENCOUNTER — Encounter (HOSPITAL_BASED_OUTPATIENT_CLINIC_OR_DEPARTMENT_OTHER): Payer: Self-pay | Admitting: Physical Therapy

## 2022-01-13 ENCOUNTER — Ambulatory Visit (HOSPITAL_BASED_OUTPATIENT_CLINIC_OR_DEPARTMENT_OTHER): Payer: Medicare PPO | Attending: Orthopaedic Surgery | Admitting: Physical Therapy

## 2022-01-13 DIAGNOSIS — M25651 Stiffness of right hip, not elsewhere classified: Secondary | ICD-10-CM | POA: Insufficient documentation

## 2022-01-13 DIAGNOSIS — M25551 Pain in right hip: Secondary | ICD-10-CM | POA: Diagnosis present

## 2022-01-13 DIAGNOSIS — M6281 Muscle weakness (generalized): Secondary | ICD-10-CM | POA: Insufficient documentation

## 2022-01-13 DIAGNOSIS — R262 Difficulty in walking, not elsewhere classified: Secondary | ICD-10-CM | POA: Insufficient documentation

## 2022-01-13 NOTE — Therapy (Signed)
OUTPATIENT PHYSICAL THERAPY TREATMENT NOTE   Patient Name: Veronica Cortez MRN: [id number] DOB:11-14-1970, 51 y.o., female Today's Date: 01/13/2022      PT End of Session - 01/13/22 1400     Visit Number 2    Number of Visits 19    Date for PT Re-Evaluation 03/16/22    Authorization Type [employer] MCR    PT Start Time 1400    PT Stop Time 1440    PT Time Calculation (min) 40 min    Activity Tolerance Patient tolerated treatment well    Behavior During Therapy WFL for tasks assessed/performed             Past Medical History:  Diagnosis Date   Asthma    Depression    Diabetes mellitus without complication ([place])    Fibromyalgia    GERD (gastroesophageal reflux disease)    Hypertension    IBD (inflammatory bowel disease)    Migraine    Neuropathy    Sleep apnea    Past Surgical History:  Procedure Laterality Date   ABDOMINAL HYSTERECTOMY     BACK SURGERY  2013   ENDOSCOPIC PLANTAR FASCIOTOMY     HAND SURGERY     HIP PINNING,CANNULATED Right 10/18/2021   Procedure: CANNULATED HIP PINNING;  Surgeon: Vanetta Mulders, MD;  Location: [place];  Service: Orthopedics;  Laterality: Right;   TUBAL LIGATION     Patient Active Problem List   Diagnosis Date Noted   Femoral fracture ([place]) 10/18/2021   Closed subcapital fracture of right femur ([place])    Fall    Hyperglycemia due to diabetes mellitus ([place])    Acute hemorrhagic colitis 04/16/2021   Insulin-requiring or dependent type II diabetes mellitus ([place])    Asthma    Essential hypertension    Depression     PCP: Roselee Nova, MD   REFERRING PROVIDER: Vanetta Mulders, MD   REFERRING DIAG: S72.011D (ICD-10-CM) - Closed subcapital fracture of right femur with routine healing, subsequent encounter   S/p 6 week R hip pinning  Did HHPT but should begin hip stregthening program   THERAPY DIAG:  Pain in right hip   Stiffness of right hip, not elsewhere classified   Muscle weakness (generalized)    Difficulty walking   ONSET DATE: 10/18/2021    Right hip cannulated screw placement   SUBJECTIVE:    SUBJECTIVE STATEMENT: Pt reports she was sick and had to miss therapy sessions; feeling better now.  She has been completing HEP 3-4x/wk; still challenging.    PERTINENT HISTORY: 2013 discectomy L4-L5, L5-S1, DM, neuropathy, history of dizziness, migraines- ICP issues   PAIN:  Are you having pain? No:    PRECAUTIONS: None   WEIGHT BEARING RESTRICTIONS Yes WBAT   FALLS:  Has patient fallen in last 6 months? Yes. 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 She would like to get back into the garden and return to yardwork.      OBJECTIVE: * Findings taken at eval unless otherwise noted.    DIAGNOSTIC FINDINGS:    IMPRESSION:  Status post ORIF of the proximal right femur with unchanged mild femoral neck displacement but no evidence of hardware failure.     PATIENT SURVEYS:  Lower Extremity Functional Score: 35 / 80 = 43.8 %   COGNITION:           Overall cognitive status: Within functional limits for tasks assessed                          SENSATION: Light touch: Impaired    MUSCLE LENGTH: WNL at HS and Quads on R   POSTURE:  WFL   PALPATION: No TTP about R hip; mild hypertonicity of glutes   LE ROM:   Active ROM Right 12/16/2021 Left 12/16/2021  Hip flexion 110 p! WNL  Hip extension 5 WNL  Hip abduction WNL WNL  Hip internal rotation 25 WNL  Hip external rotation WNL WNL  Knee flexion WNL WNL  Knee extension WNL WNL   (Blank rows = not tested)   LE MMT:   MMT  Right 12/16/2021 Left 12/16/2021  Hip flexion 4/5 4+/5  Hip extension 4/5 4+/5  Hip abduction 4/5 4+/5  Hip adduction 4/5 5/5  Knee flexion 4+/5 4+/5  Knee extension 4+/5 5/5   (Blank rows = not tested)       FUNCTIONAL TESTS:  5 times sit to stand: 17s  UE needed to push on knees   GAIT: Distance walked: 47ft Assistive device utilized: Single point cane Level of assistance: Complete Independence Comments: circumduction, decreased stance time on R, widened BOS   Stairs: step to pattern with UE and SPC, VC and TC needed for sequencing and safety   TODAY'S TREATMENT: Pt seen for aquatic therapy today.  Treatment took place in water 3.25-4 ft in depth at the [facility] pool. Temp of water was 91.  Pt entered/exited the pool via stairs independently with bilat rail.  - Fwd/ bwd walking; side stepping without support - Backward gait holding yellow noodle with cues for even step length; repeated with increased speed - Holding wall: Hip flex/ext x 10 each leg; Hip abdct / add x 10 each leg - holding yellow noodle (resting against wall):  stork stance with hip IR/ER - squats x 5; repeated pushing yellow hand buoy under water x 10 - foot taps to 1st/2nd step, alternating LEs - forward step up with RLE x 10  - STS from bench with feet on blue step, cues for controlled descent - straddling yellow noodle: forward cycling  - standing hip flexor stretch R/L   Pt requires buoyancy for support and to offload joints with strengthening exercises. Viscosity of the water is needed for resistance of strengthening; water current perturbations provides challenge to standing balance unsupported, requiring increased core activation.      PATIENT EDUCATION:  Education details: MOI, diagnosis, prognosis, anatomy, exercise progression, DOMS expectations, muscle firing,  envelope of function, HEP, POC   Person educated: Patient Education method: Explanation, Demonstration, Tactile cues,  Verbal cues, and Handouts Education comprehension: verbalized understanding, returned demonstration, verbal cues required, tactile cues required, and needs further education     HOME EXERCISE PROGRAM: Access Code: [email or web address] URL: [email or web address] Date: 12/16/2021 Prepared by: Daleen Bo       ASSESSMENT:   CLINICAL IMPRESSION: Pt is confident and independent in the aquatic setting; able to take direction from therapist on deck.  She reported no increase in pain, just fatigue in LEs. Tolerated all exercises well.  Pt voiced  interest in checking out [facility], as place to continue her aquatic HEP after d/c. She may benefit from functional strengthening in Rt quad (ie: stairs).    Pt's history of dizziness and neuropathy place her at an increased risk for falls. Pt would benefit from continued skilled therapy in order to reach goals and maximize functional community mobility, strength, and ROM for return to PLOF.      OBJECTIVE IMPAIRMENTS Abnormal gait, decreased activity tolerance, decreased balance, decreased endurance, decreased knowledge of use of DME, decreased mobility, difficulty walking, decreased ROM, decreased strength, dizziness, hypomobility, increased fascial restrictions, increased muscle spasms, impaired flexibility, impaired sensation, improper body mechanics, postural dysfunction, and pain.    ACTIVITY LIMITATIONS cleaning, community activity, occupation, laundry, yard work, shopping, and exercise .    PERSONAL FACTORS Age, Fitness, Past/current experiences, and 3+ comorbidities:    are also affecting patient's functional outcome.      REHAB POTENTIAL: Good   CLINICAL DECISION MAKING: Stable/uncomplicated   EVALUATION COMPLEXITY: Low     GOALS:     SHORT TERM GOALS: Target date: 01/27/2022     Pt will become independent with HEP in order to demonstrate synthesis of PT education.     Goal status: INITIAL   2.  Pt will be able to demonstrate ability to  perform STS without UE from std chair in order to demonstrate functional improvement in LE function for self-care and house hold duties.    Goal status: INITIAL   3.  Pt will be able to demonstrate/report ability to sit/stand/sleep for extended periods of time without pain in order to demonstrate functional improvement and tolerance to static positioning.    Goal status: INITIAL       LONG TERM GOALS: Target date: 03/10/2022     Pt  will become independent with final HEP in order to demonstrate synthesis of PT education.   Baseline:  Goal status: INITIAL   2.  Pt will be able to demonstrate reciprocal stair stepping in order to demonstrate functional improvement in LE function for self-care and house hold duties.  Baseline:  Goal status: INITIAL   3.  Pt will be able to demonstrate/report ability to walk >30 mins without pain in order to demonstrate functional improvement and tolerance to exercise and community mobility.  Baseline:  Goal status: INITIAL   4.  Pt will be able to perform 5XSTS in under 12s  in order to demonstrate functional improvement above the cut off score for adults.  Baseline:  Goal status: INITIAL   5. Pt will have an at least 18 pt improvement in LEFS measure in order to demonstrate MCID improvement in daily function.             Goal status: INITIAL     PLAN: PT FREQUENCY: 1-2x/week   PT DURATION: 12 weeks (likely DC by 8 wks)   PLANNED INTERVENTIONS: Therapeutic exercises, Therapeutic activity, Neuromuscular re-education, Balance training, Gait training, Patient/Family education, Joint manipulation, Joint mobilization, Stair training, Orthotic/Fit training, DME instructions, Aquatic Therapy, Dry Needling, Electrical stimulation, Spinal manipulation, Spinal mobilization, Cryotherapy, Moist heat, scar mobilization, Splintting, Taping, Vasopneumatic device, Traction, Ultrasound, Ionotophoresis 4mg /ml Dexamethasone, and Manual therapy   PLAN FOR NEXT  SESSION: review HEP, progress/modify HHPT HEP, start balance; continue aquatics.    Kerin Perna, PTA 01/13/22 2:43 PM

## 2022-01-15 ENCOUNTER — Ambulatory Visit (INDEPENDENT_AMBULATORY_CARE_PROVIDER_SITE_OTHER): Payer: Medicare PPO | Admitting: Orthopaedic Surgery

## 2022-01-15 ENCOUNTER — Ambulatory Visit (HOSPITAL_BASED_OUTPATIENT_CLINIC_OR_DEPARTMENT_OTHER): Payer: Medicare PPO | Admitting: Physical Therapy

## 2022-01-15 ENCOUNTER — Ambulatory Visit (INDEPENDENT_AMBULATORY_CARE_PROVIDER_SITE_OTHER): Payer: Medicare PPO

## 2022-01-15 ENCOUNTER — Encounter (HOSPITAL_BASED_OUTPATIENT_CLINIC_OR_DEPARTMENT_OTHER): Payer: Self-pay | Admitting: Physical Therapy

## 2022-01-15 DIAGNOSIS — M6281 Muscle weakness (generalized): Secondary | ICD-10-CM

## 2022-01-15 DIAGNOSIS — S72011D Unspecified intracapsular fracture of right femur, subsequent encounter for closed fracture with routine healing: Secondary | ICD-10-CM

## 2022-01-15 DIAGNOSIS — R262 Difficulty in walking, not elsewhere classified: Secondary | ICD-10-CM

## 2022-01-15 DIAGNOSIS — M25651 Stiffness of right hip, not elsewhere classified: Secondary | ICD-10-CM

## 2022-01-15 DIAGNOSIS — M25551 Pain in right hip: Secondary | ICD-10-CM

## 2022-01-15 NOTE — Therapy (Signed)
OUTPATIENT PHYSICAL THERAPY TREATMENT NOTE   Patient Name: Veronica KindredJessica Marie Coppin MRN: [id number] DOB:09/05/1970, 51 y.o., female Today's Date: 01/15/2022      PT End of Session - 01/15/22 1250     Visit Number 3    Number of Visits 19    Date for PT Re-Evaluation 03/16/22    Authorization Type [employer] MCR    PT Start Time 1300    PT Stop Time 1340    PT Time Calculation (min) 40 min    Activity Tolerance Patient tolerated treatment well    Behavior During Therapy WFL for tasks assessed/performed             Past Medical History:  Diagnosis Date   Asthma    Depression    Diabetes mellitus without complication ([facility])    Fibromyalgia    GERD (gastroesophageal reflux disease)    Hypertension    IBD (inflammatory bowel disease)    Migraine    Neuropathy    Sleep apnea    Past Surgical History:  Procedure Laterality Date   ABDOMINAL HYSTERECTOMY     BACK SURGERY  2013   ENDOSCOPIC PLANTAR FASCIOTOMY     HAND SURGERY     HIP PINNING,CANNULATED Right 10/18/2021   Procedure: CANNULATED HIP PINNING;  Surgeon: Huel CoteBokshan, Steven, MD;  Location: [facility];  Service: Orthopedics;  Laterality: Right;   TUBAL LIGATION     Patient Active Problem List   Diagnosis Date Noted   Femoral fracture ([facility]) 10/18/2021   Closed subcapital fracture of right femur ([facility])    Fall    Hyperglycemia due to diabetes mellitus ([facility])    Acute hemorrhagic colitis 04/16/2021   Insulin-requiring or dependent type II diabetes mellitus ([facility])    Asthma    Essential hypertension    Depression     PCP: Ellyn HackShah, Syed Asad A, MD   REFERRING PROVIDER: Huel CoteBokshan, Steven, MD   REFERRING DIAG: S72.011D (ICD-10-CM) - Closed subcapital fracture of right femur with routine healing, subsequent encounter   S/p 6 week R hip pinning  Did HHPT but should begin hip stregthening program   THERAPY DIAG:  Pain in right hip   Stiffness of right hip, not elsewhere classified   Muscle weakness (generalized)    Difficulty walking   ONSET DATE: 10/18/2021    Right hip cannulated screw placement   SUBJECTIVE:    SUBJECTIVE STATEMENT: Pt states is a little sore today from the pool today. She is feeling good today. She is only sore into the R leg/hip.    PERTINENT HISTORY: 2013 discectomy L4-L5, L5-S1, DM, neuropathy, history of dizziness, migraines- ICP issues   PAIN:  Are you having pain? No:    PRECAUTIONS: None   WEIGHT BEARING RESTRICTIONS Yes WBAT   FALLS:  Has patient fallen in last 6 months? Yes. 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 She would like to get back into the garden and return to yardwork.      OBJECTIVE: * Findings taken at eval unless otherwise noted.    DIAGNOSTIC FINDINGS:  IMPRESSION: Status post ORIF of the proximal right femur with unchanged mild femoral neck displacement but no evidence of hardware failure.     PATIENT SURVEYS:  Lower Extremity Functional Score: 35 / 80 = 43.8 %      TODAY'S TREATMENT:  6/8  Nustep L5 5 min - Clamshell with GTB Resistance  -10x each -figure 4 bridge 2x10 - Side Stepping with Resistance at Thighs and Counter Support  3x table laps - Standing Hip Flexor Stretch  - 3  reps - 30 hold -Piriformis stretch 30s 3x (supine pushing down) -Prone quad stretch 30s 3x -tandem on airex 30s 3x -shuttle leg press staggered stance (R in rear) 2x10 87lbs of bands  -SL shuttle leg press (R)  2x10 25lb, 12lb, 6lb band -knee extension machine 2x10 25lbs    6/6 Pt seen for aquatic therapy today.  Treatment took place in water 3.25-4  ft in depth at the [facility] pool. Temp of water was 91.  Pt entered/exited the pool via stairs independently with bilat rail.  - Fwd/ bwd walking; side stepping without support - Backward gait holding yellow noodle with cues for even step length; repeated with increased speed - Holding wall: Hip flex/ext x 10 each leg; Hip abdct / add x 10 each leg - holding yellow noodle (resting against wall):  stork stance with hip IR/ER - squats x 5; repeated pushing yellow hand buoy under water x 10 - foot taps to 1st/2nd step, alternating LEs - forward step up with RLE x 10  - STS from bench with feet on blue step, cues for controlled descent - straddling yellow noodle: forward cycling  - standing hip flexor stretch R/L   Pt requires buoyancy for support and to offload joints with strengthening exercises. Viscosity of the water is needed for resistance of strengthening; water current perturbations provides challenge to standing balance unsupported, requiring increased core activation.      PATIENT EDUCATION:  Education details: exercise progression, DOMS expectations, muscle firing,  envelope of function, HEP, POC   Person educated: Patient Education method: Explanation, Demonstration, Tactile cues, Verbal cues, and Handouts Education comprehension: verbalized understanding, returned demonstration, verbal cues required, tactile cues required, and needs further education     HOME EXERCISE PROGRAM: Access Code: [email or web address] URL: [email or web address] Date: 12/16/2021 Prepared by: Zebedee Iba       ASSESSMENT:   CLINICAL IMPRESSION: Pt able to progress land based session with R quad/LE focused strengthening at today's session without increased pain. Pt does start session with increased soreness at beginning of session that was improved with dynamic warmup and stretching. Plan to continue with alternating land and aquatic sessions. Consider adding SL stability to land HEP at  next session. Pt likely to have increased DOMS after today's strength based session. Pt's history of dizziness and neuropathy place her at an increased risk for falls. Pt would benefit from continued skilled therapy in order to reach goals and maximize functional community mobility, strength, and ROM for return to PLOF.      OBJECTIVE IMPAIRMENTS Abnormal gait, decreased activity tolerance, decreased balance, decreased endurance, decreased knowledge of use of DME, decreased mobility, difficulty walking, decreased ROM, decreased strength, dizziness, hypomobility, increased fascial restrictions, increased muscle spasms, impaired flexibility, impaired sensation, improper body mechanics, postural dysfunction, and pain.    ACTIVITY LIMITATIONS cleaning, community activity, occupation, laundry, yard work, shopping, and exercise .    PERSONAL FACTORS Age, Fitness, Past/current experiences, and 3+ comorbidities:    are also affecting patient's functional  outcome.      REHAB POTENTIAL: Good   CLINICAL DECISION MAKING: Stable/uncomplicated   EVALUATION COMPLEXITY: Low     GOALS:     SHORT TERM GOALS: Target date: 01/27/2022     Pt will become independent with HEP in order to demonstrate synthesis of PT education.     Goal status: INITIAL   2.  Pt will be able to demonstrate ability to perform STS without UE from std chair in order to demonstrate functional improvement in LE function for self-care and house hold duties.    Goal status: INITIAL   3.  Pt will be able to demonstrate/report ability to sit/stand/sleep for extended periods of time without pain in order to demonstrate functional improvement and tolerance to static positioning.    Goal status: INITIAL       LONG TERM GOALS: Target date: 03/10/2022     Pt  will become independent with final HEP in order to demonstrate synthesis of PT education.   Baseline:  Goal status: INITIAL   2.  Pt will be able to demonstrate reciprocal  stair stepping in order to demonstrate functional improvement in LE function for self-care and house hold duties.  Baseline:  Goal status: INITIAL   3.  Pt will be able to demonstrate/report ability to walk >30 mins without pain in order to demonstrate functional improvement and tolerance to exercise and community mobility.  Baseline:  Goal status: INITIAL   4.  Pt will be able to perform 5XSTS in under 12s  in order to demonstrate functional improvement above the cut off score for adults.  Baseline:  Goal status: INITIAL   5. Pt will have an at least 18 pt improvement in LEFS measure in order to demonstrate MCID improvement in daily function.             Goal status: INITIAL     PLAN: PT FREQUENCY: 1-2x/week   PT DURATION: 12 weeks (likely DC by 8 wks)   PLANNED INTERVENTIONS: Therapeutic exercises, Therapeutic activity, Neuromuscular re-education, Balance training, Gait training, Patient/Family education, Joint manipulation, Joint mobilization, Stair training, Orthotic/Fit training, DME instructions, Aquatic Therapy, Dry Needling, Electrical stimulation, Spinal manipulation, Spinal mobilization, Cryotherapy, Moist heat, scar mobilization, Splintting, Taping, Vasopneumatic device, Traction, Ultrasound, Ionotophoresis 4mg /ml Dexamethasone, and Manual therapy   PLAN FOR NEXT SESSION: review HEP, progress/modify HHPT HEP, start balance; continue aquatics.    PT, DPT 01/15/22 1:45 PM

## 2022-01-15 NOTE — Progress Notes (Signed)
Post Operative Evaluation    Procedure/Date of Surgery: 11/06/21 right hip percutaneous pinning  Interval History:   Presents today for follow-up of her right hip percutaneous pinning.  Overall she is doing extremely well.  She continues to work with physical therapy.  She is on both aquatic and land therapy.  She has really no pain in the hip at this time.  She is back to her baseline of walking with a cane.  She is extremely pleased with how she is doing.   PMH/PSH/Family History/Social History/Meds/Allergies:    Past Medical History:  Diagnosis Date   Asthma    Depression    Diabetes mellitus without complication (HCC)    Fibromyalgia    GERD (gastroesophageal reflux disease)    Hypertension    IBD (inflammatory bowel disease)    Migraine    Neuropathy    Sleep apnea    Past Surgical History:  Procedure Laterality Date   ABDOMINAL HYSTERECTOMY     BACK SURGERY  2013   ENDOSCOPIC PLANTAR FASCIOTOMY     HAND SURGERY     HIP PINNING,CANNULATED Right 10/18/2021   Procedure: CANNULATED HIP PINNING;  Surgeon: [name], MD;  Location: [facility];  Service: Orthopedics;  Laterality: Right;   TUBAL LIGATION     Social History   Socioeconomic History   Marital status: Single    Spouse name: Not on file   Number of children: Not on file   Years of education: Not on file   Highest education level: Not on file  Occupational History   Not on file  Tobacco Use   Smoking status: Former    Types: Cigarettes    Quit date: 2012    Years since quitting: 11.4   Smokeless tobacco: Never  Vaping Use   Vaping Use: Never used  Substance and Sexual Activity   Alcohol use: Yes    Alcohol/week: 2.0 standard drinks of alcohol    Types: 2 Glasses of wine per week   Drug use: Never   Sexual activity: Not on file  Other Topics Concern   Not on file  Social History Narrative   Not on file   Social Determinants of Health   Financial Resource  Strain: Not on file  Food Insecurity: Not on file  Transportation Needs: Not on file  Physical Activity: Not on file  Stress: Not on file  Social Connections: Not on file   Family History  Problem Relation Age of Onset   Breast cancer Mother    Kidney failure Father    Allergies  Allergen Reactions   Acetazolamide Other (See Comments)    unknown   Benadryl [Diphenhydramine] Other (See Comments)    unknown   Clarithromycin Other (See Comments)    unknown   Compazine [Prochlorperazine] Other (See Comments)    unknown   Fentanyl Other (See Comments)    unknown   Lamictal [Lamotrigine] Other (See Comments)    unknown   Other Other (See Comments)    Triptans and statins; unknown   Reglan [Metoclopramide] Other (See Comments)    unknown   Topiramate Other (See Comments)    unknown   Current Outpatient Medications  Medication Sig Dispense Refill   ADVAIR HFA 115-21 MCG/ACT inhaler Inhale 2 puffs into the lungs 2 (two) times daily.  AJOVY 225 MG/1.5ML SOAJ Inject 1.5 mLs into the skin every 30 (thirty) days.     albuterol (VENTOLIN HFA) 108 (90 Base) MCG/ACT inhaler Inhale 2 puffs into the lungs every 4 (four) hours as needed for wheezing or shortness of breath.     amitriptyline (ELAVIL) 100 MG tablet Take 100 mg by mouth at bedtime.     aspirin-acetaminophen-caffeine (EXCEDRIN MIGRAINE) 250-250-65 MG tablet Take 2 tablets by mouth every 6 (six) hours as needed for headache.     atenolol (TENORMIN) 25 MG tablet Take 25 mg by mouth daily.     dicyclomine (BENTYL) 10 MG capsule Take 1 capsule (10 mg total) by mouth 3 (three) times daily before meals. 90 capsule 0   enoxaparin (LOVENOX) 40 MG/0.4ML injection Inject 0.4 mLs (40 mg total) into the skin daily for 14 days. 5.6 mL 0   FLUoxetine (PROZAC) 40 MG capsule Take 40 mg by mouth daily.     HYDROcodone-acetaminophen (NORCO/VICODIN) 5-325 MG tablet Take 1 tablet by mouth every 6 (six) hours as needed for moderate pain. 30  tablet 0   Insulin Aspart FlexPen (NOVOLOG) 100 UNIT/ML Inject 10-16 Units into the skin 3 (three) times daily.     JANUVIA 100 MG tablet Take 100 mg by mouth daily.     LANTUS SOLOSTAR 100 UNIT/ML Solostar Pen Inject 30 Units into the skin at bedtime.     Melatonin 10 MG TABS Take 10 mg by mouth at bedtime.     montelukast (SINGULAIR) 10 MG tablet Take 10 mg by mouth daily.     Omega-3 Fatty Acids (FISH OIL PO) Take 1 capsule by mouth daily.     omeprazole (PRILOSEC) 20 MG capsule Take 20 mg by mouth 2 (two) times daily.     Probiotic Product (PROBIOTIC PO) Take 1 capsule by mouth daily.     promethazine (PHENERGAN) 25 MG tablet Take 25 mg by mouth daily as needed for nausea/vomiting.     Riboflavin (B2 PO) Take 1 tablet by mouth daily.     rosuvastatin (CRESTOR) 10 MG tablet Take 10 mg by mouth at bedtime.     topiramate (TOPAMAX) 200 MG tablet Take 200 mg by mouth 2 (two) times daily.     UBRELVY 100 MG TABS Take 100 mg by mouth 2 (two) times daily as needed for migraine.     VITAMIN E PO Take 1 tablet by mouth daily.     Current Facility-Administered Medications  Medication Dose Route Frequency Provider Last Rate Last Admin   acetaminophen (TYLENOL) tablet 1,000 mg  1,000 mg Oral Q8H Brimage, Vondra, DO       No results found.  Review of Systems:   A ROS was performed including pertinent positives and negatives as documented in the HPI.   Musculoskeletal Exam:    There were no vitals taken for this visit.  Right lateral skin incision is well-healed.  Painless range of motion with 20 degrees internal and 20 degrees external rotation at the right hip.  She is able to go into figure-of-four position with 0 pain.  Distal  Imaging:    X-ray right hip 2 views: Status post percutaneous pinning without evidence of hardware failure.  Healed femoral neck fracture  I personally reviewed and interpreted the radiographs.   Assessment:   51 year old female is 12 weeks status post right  femoral neck pinning overall doing extremely well.  At this time she will be activity as tolerated and I will see her as needed  Plan :    -Return to clinic as needed     I personally saw and evaluated the patient, and participated in the management and treatment plan.  [name], MD Attending Physician, Orthopedic Surgery  This document was dictated using Dragon voice recognition software. A reasonable attempt at proof reading has been made to minimize errors.

## 2022-01-20 ENCOUNTER — Ambulatory Visit (HOSPITAL_BASED_OUTPATIENT_CLINIC_OR_DEPARTMENT_OTHER): Payer: Medicare PPO | Admitting: Physical Therapy

## 2022-01-22 ENCOUNTER — Ambulatory Visit (HOSPITAL_BASED_OUTPATIENT_CLINIC_OR_DEPARTMENT_OTHER): Payer: Medicare PPO | Admitting: Physical Therapy

## 2022-01-27 ENCOUNTER — Ambulatory Visit (HOSPITAL_BASED_OUTPATIENT_CLINIC_OR_DEPARTMENT_OTHER): Payer: Medicare PPO | Admitting: Physical Therapy

## 2022-01-29 ENCOUNTER — Encounter (HOSPITAL_BASED_OUTPATIENT_CLINIC_OR_DEPARTMENT_OTHER): Payer: Medicare PPO | Admitting: Physical Therapy

## 2022-02-03 ENCOUNTER — Ambulatory Visit (HOSPITAL_BASED_OUTPATIENT_CLINIC_OR_DEPARTMENT_OTHER): Payer: Medicare PPO | Admitting: Physical Therapy

## 2022-02-05 ENCOUNTER — Encounter (HOSPITAL_BASED_OUTPATIENT_CLINIC_OR_DEPARTMENT_OTHER): Payer: Medicare PPO | Admitting: Physical Therapy

## 2022-09-13 ENCOUNTER — Other Ambulatory Visit: Payer: Self-pay

## 2022-09-13 ENCOUNTER — Emergency Department (HOSPITAL_COMMUNITY)
Admission: EM | Admit: 2022-09-13 | Discharge: 2022-09-13 | Disposition: A | Discharge status: Home / Self Care | Payer: Medicare PPO | Attending: Emergency Medicine | Admitting: Emergency Medicine

## 2022-09-13 ENCOUNTER — Emergency Department (HOSPITAL_COMMUNITY): Payer: Medicare PPO

## 2022-09-13 DIAGNOSIS — Z794 Long term (current) use of insulin: Secondary | ICD-10-CM | POA: Diagnosis not present

## 2022-09-13 DIAGNOSIS — S42211A Unspecified displaced fracture of surgical neck of right humerus, initial encounter for closed fracture: Secondary | ICD-10-CM | POA: Diagnosis not present

## 2022-09-13 DIAGNOSIS — W19XXXA Unspecified fall, initial encounter: Secondary | ICD-10-CM | POA: Diagnosis not present

## 2022-09-13 DIAGNOSIS — M25511 Pain in right shoulder: Secondary | ICD-10-CM | POA: Insufficient documentation

## 2022-09-13 DIAGNOSIS — S50811A Abrasion of right forearm, initial encounter: Secondary | ICD-10-CM | POA: Insufficient documentation

## 2022-09-13 DIAGNOSIS — S4991XA Unspecified injury of right shoulder and upper arm, initial encounter: Secondary | ICD-10-CM | POA: Diagnosis present

## 2022-09-13 MED ORDER — OXYCODONE-ACETAMINOPHEN 5-325 MG PO TABS: 1.0000 | ORAL_TABLET | Freq: Four times a day (QID) | ORAL | 0 refills | Status: DC | PRN

## 2022-09-13 MED ORDER — HYDROCODONE-ACETAMINOPHEN 5-325 MG PO TABS
1.0000 | ORAL_TABLET | Freq: Once | ORAL | Status: AC
  Administered 2022-09-13: 1 via ORAL
  Filled 2022-09-13: qty 1

## 2022-09-13 MED ORDER — ONDANSETRON 4 MG PO TBDP
8.0000 mg | ORAL_TABLET | Freq: Once | ORAL | Status: AC
  Administered 2022-09-13: 8 mg via ORAL
  Filled 2022-09-13: qty 2

## 2022-09-13 MED ORDER — ONDANSETRON 8 MG PO TBDP: 8.0000 mg | ORAL_TABLET | Freq: Three times a day (TID) | ORAL | 0 refills | Status: DC | PRN

## 2022-09-13 MED ORDER — HYDROMORPHONE HCL 1 MG/ML IJ SOLN
1.0000 mg | Freq: Once | INTRAMUSCULAR | Status: AC
  Administered 2022-09-13: 1 mg via INTRAMUSCULAR
  Filled 2022-09-13: qty 1

## 2022-09-13 NOTE — ED Provider Notes (Signed)
[place] EMERGENCY DEPARTMENT AT [facility] Provider Note   CSN: [id number] Arrival date & time: 09/13/22  1843     History  Chief Complaint  Patient presents with   Shoulder Injury    Veronica Cortez is a 52 y.o. female.   Shoulder Injury     This is a 52 year old female presenting to the emergency department by EMS due to right shoulder pain.  Patient had a mechanical fall where she tripped over her cat and landed on her right arm and shoulder.  She is not having any pain to the hand, wrist, elbow but does have significant pain to the right shoulder and is unable to move it without significant pain.  She is not on any blood thinners, did not hit head or lose consciousness.  She does have superficial abrasions to the right forearm without any obvious laceration or contusion.  Given 100 of fentanyl and route by EMS pain is still severe.  Home Medications Prior to Admission medications   Medication Sig Start Date End Date Taking? Authorizing Provider  ondansetron (ZOFRAN-ODT) 8 MG disintegrating tablet Take 1 tablet (8 mg total) by mouth every 8 (eight) hours as needed for nausea or vomiting. 09/13/22  Yes Theron Arista, PA-C  oxyCODONE-acetaminophen (PERCOCET/ROXICET) 5-325 MG tablet Take 1 tablet by mouth every 6 (six) hours as needed for severe pain. 09/13/22  Yes Theron Arista, PA-C  ADVAIR HFA 115-21 MCG/ACT inhaler Inhale 2 puffs into the lungs 2 (two) times daily. 03/20/21   [provider]  AJOVY 225 MG/1.5ML SOAJ Inject 1.5 mLs into the skin every 30 (thirty) days. 04/09/21   [provider]  albuterol (VENTOLIN HFA) 108 (90 Base) MCG/ACT inhaler Inhale 2 puffs into the lungs every 4 (four) hours as needed for wheezing or shortness of breath. 03/17/21   [provider]  amitriptyline (ELAVIL) 100 MG tablet Take 100 mg by mouth at bedtime. 04/08/21   [provider]  aspirin-acetaminophen-caffeine (EXCEDRIN MIGRAINE) [phone] MG  tablet Take 2 tablets by mouth every 6 (six) hours as needed for headache.    [provider]  atenolol (TENORMIN) 25 MG tablet Take 25 mg by mouth daily. 04/08/21   [provider]  dicyclomine (BENTYL) 10 MG capsule Take 1 capsule (10 mg total) by mouth 3 (three) times daily before meals. 04/18/21 10/18/21  [name], MD  enoxaparin (LOVENOX) 40 MG/0.4ML injection Inject 0.4 mLs (40 mg total) into the skin daily for 14 days. 10/20/21 11/03/21  Katha Cabal, DO  FLUoxetine (PROZAC) 40 MG capsule Take 40 mg by mouth daily. 04/08/21   [provider]  HYDROcodone-acetaminophen (NORCO/VICODIN) 5-325 MG tablet Take 1 tablet by mouth every 6 (six) hours as needed for moderate pain. 11/06/21   [name], MD  Insulin Aspart FlexPen (NOVOLOG) 100 UNIT/ML Inject 10-16 Units into the skin 3 (three) times daily. 04/11/21   [provider]  JANUVIA 100 MG tablet Take 100 mg by mouth daily. 04/08/21   [provider]  LANTUS SOLOSTAR 100 UNIT/ML Solostar Pen Inject 30 Units into the skin at bedtime. 03/18/21   [provider]  Melatonin 10 MG TABS Take 10 mg by mouth at bedtime.    [provider]  montelukast (SINGULAIR) 10 MG tablet Take 10 mg by mouth daily. 03/17/21   [provider]  Omega-3 Fatty Acids (FISH OIL PO) Take 1 capsule by mouth daily.    [provider]  omeprazole (PRILOSEC) 20 MG capsule  Take 20 mg by mouth 2 (two) times daily. 04/08/21   [provider]  Probiotic Product (PROBIOTIC PO) Take 1 capsule by mouth daily.    [provider]  promethazine (PHENERGAN) 25 MG tablet Take 25 mg by mouth daily as needed for nausea/vomiting. 04/08/21   [provider]  Riboflavin (B2 PO) Take 1 tablet by mouth daily.    [provider]  rosuvastatin (CRESTOR) 10 MG tablet Take 10 mg by mouth at bedtime. 04/08/21   [provider]  topiramate (TOPAMAX) 200 MG tablet Take 200 mg  by mouth 2 (two) times daily. 03/18/21   [provider]  UBRELVY 100 MG TABS Take 100 mg by mouth 2 (two) times daily as needed for migraine. 04/09/21   [provider]  VITAMIN E PO Take 1 tablet by mouth daily.    [provider]      Allergies    Acetazolamide, Benadryl [diphenhydramine], Clarithromycin, Compazine [prochlorperazine], Fentanyl, Lamictal [lamotrigine], Other, Reglan [metoclopramide], and Topiramate    Review of Systems   Review of Systems  Physical Exam Updated Vital Signs BP 118/70   Pulse 82   SpO2 98%  Physical Exam Vitals and nursing note reviewed. Exam conducted with a chaperone present.  Constitutional:      General: She is not in acute distress.    Appearance: Normal appearance.  HENT:     Head: Normocephalic and atraumatic.  Eyes:     General: No scleral icterus.    Extraocular Movements: Extraocular movements intact.     Pupils: Pupils are equal, round, and reactive to light.  Cardiovascular:     Pulses: Normal pulses.  Musculoskeletal:        General: Tenderness present.     Comments: In tact passive ROM to right wrist, elbow.  Significant pain with any movement of the right shoulder.  No tenderness over chest wall or clavicles.  Skin:    Capillary Refill: Capillary refill takes less than 2 seconds.     Coloration: Skin is not jaundiced.     Comments: Superficial abrasions to right forearm  Neurological:     Mental Status: She is alert. Mental status is at baseline.     Coordination: Coordination normal.     ED Results / Procedures / Treatments   Labs (all labs ordered are listed, but only abnormal results are displayed) Labs Reviewed - No data to display  EKG None  Radiology DG Shoulder Right  Result Date: 09/13/2022 CLINICAL DATA:  pain EXAM: RIGHT SHOULDER - 2+ VIEW COMPARISON:  None Available. FINDINGS: Comminuted acute humeral neck fracture. No dislocation. There is no evidence of arthropathy or other focal  bone abnormality. Soft tissues are unremarkable. IMPRESSION: Comminuted acute humeral neck fracture. Electronically Signed   By: Tish Frederickson M.D.   On: 09/13/2022 19:25    Procedures Procedures    Medications Ordered in ED Medications  HYDROmorphone (DILAUDID) injection 1 mg (has no administration in time range)  ondansetron (ZOFRAN-ODT) disintegrating tablet 8 mg (8 mg Oral Given 09/13/22 1912)  HYDROcodone-acetaminophen (NORCO/VICODIN) 5-325 MG per tablet 1 tablet (1 tablet Oral Given 09/13/22 1913)    ED Course/ Medical Decision Making/ A&P                             Medical Decision Making Amount and/or Complexity of Data Reviewed Radiology: ordered.  Risk Prescription drug management.   This is a 52 year old female presents to  the emergency department due to right shoulder pain.  Differential includes not limited to fracture, dislocation, neurovascular injury, myalgia.  On exam patient has brisk cap refill, radial pulses 2+.  She has reproducible tenderness over the humeral head, decreased ROM to the right shoulder.  Otherwise the right upper extremity has intact circumferential sensation, there is no reproducible tenderness with palpation or crepitus.  No obvious open fracture although she does have an superficial abrasions over the forearm.  I ordered plain film of right shoulder which shows humeral neck fracture.  No evidence of compartment syndrome, open fracture, axillary nerve compromise.  She has an orthopedic physician who she will follow-up with in the office.  Discussed with patient and her daughter this is the patient's third fracture in the last 5 years.  She is postmenopausal secondary to hysterectomy 10 years ago and she is also on Prilosec which could be contributing to osteoporosis and increased fracture risk.  I discussed with her following up with her gastroenterologist and primary care doctor regarding any preventative measures she could be on chronically.  She  will also reach out to her GI doctor regarding the chronic use of Prilosec.  Would not make any medication changes here in the ED.        Final Clinical Impression(s) / ED Diagnoses Final diagnoses:  Fx humeral neck, right, closed, initial encounter    Rx / DC Orders ED Discharge Orders          Ordered    oxyCODONE-acetaminophen (PERCOCET/ROXICET) 5-325 MG tablet  Every 6 hours PRN        09/13/22 2018    ondansetron (ZOFRAN-ODT) 8 MG disintegrating tablet  Every 8 hours PRN        09/13/22 2018              Theron Arista, PA-C 09/13/22 2020    Rondel Baton, MD 09/14/22 [phone]

## 2022-09-13 NOTE — ED Notes (Signed)
Verbal and printed D/C instructions provided, patient's questions answered, wheelchair provided at time of D/C for comfort. Patient left in POV

## 2022-09-13 NOTE — Discharge Instructions (Addendum)
You were today in the emergency department due to right shoulder fracture.  You have a comminuted fracture of the right femoral neck, wear the sling and swath.  Call Dr. Rolena Infante on tomorrow morning and let them know what happened and they need to be seen in the office.  Return to the ED if you have loss of sensation or inability to move your digits or new or concerning symptoms.  Take the pain medicine as prescribed.  Take with Zofran to prevent nausea and vomiting.

## 2022-09-13 NOTE — ED Triage Notes (Signed)
Pt BIB EMS from home. Per EMS, pt tripped over her cat and fell on her right side. No thinners, LOC, or hitting head. C/o significant pain in right shoulder and upper arm. No obvious deformity. No other injuries. A/ox4

## 2022-09-13 NOTE — Progress Notes (Signed)
Orthopedic Tech Progress Note Patient Details:  Veronica Cortez 1970/10/09 [id number]  Ortho Devices Type of Ortho Device: Sling immobilizer Ortho Device/Splint Location: rue Ortho Device/Splint Interventions: Ordered, Application, Adjustment   Post Interventions Patient Tolerated: Well Instructions Provided: Care of device, Adjustment of device  Karolee Stamps 09/13/2022, 10:21 PM

## 2022-09-21 IMAGING — DX DG HIP (WITH OR WITHOUT PELVIS) 4+V*R*
3 series · 3 of 3 positions shown · non-contrast
Comparison: 12/04/2021

CLINICAL DATA: Postoperative, right hip pinning

EXAM:
DG HIP (WITH OR WITHOUT PELVIS) 4+V RIGHT

[pelvis ap]
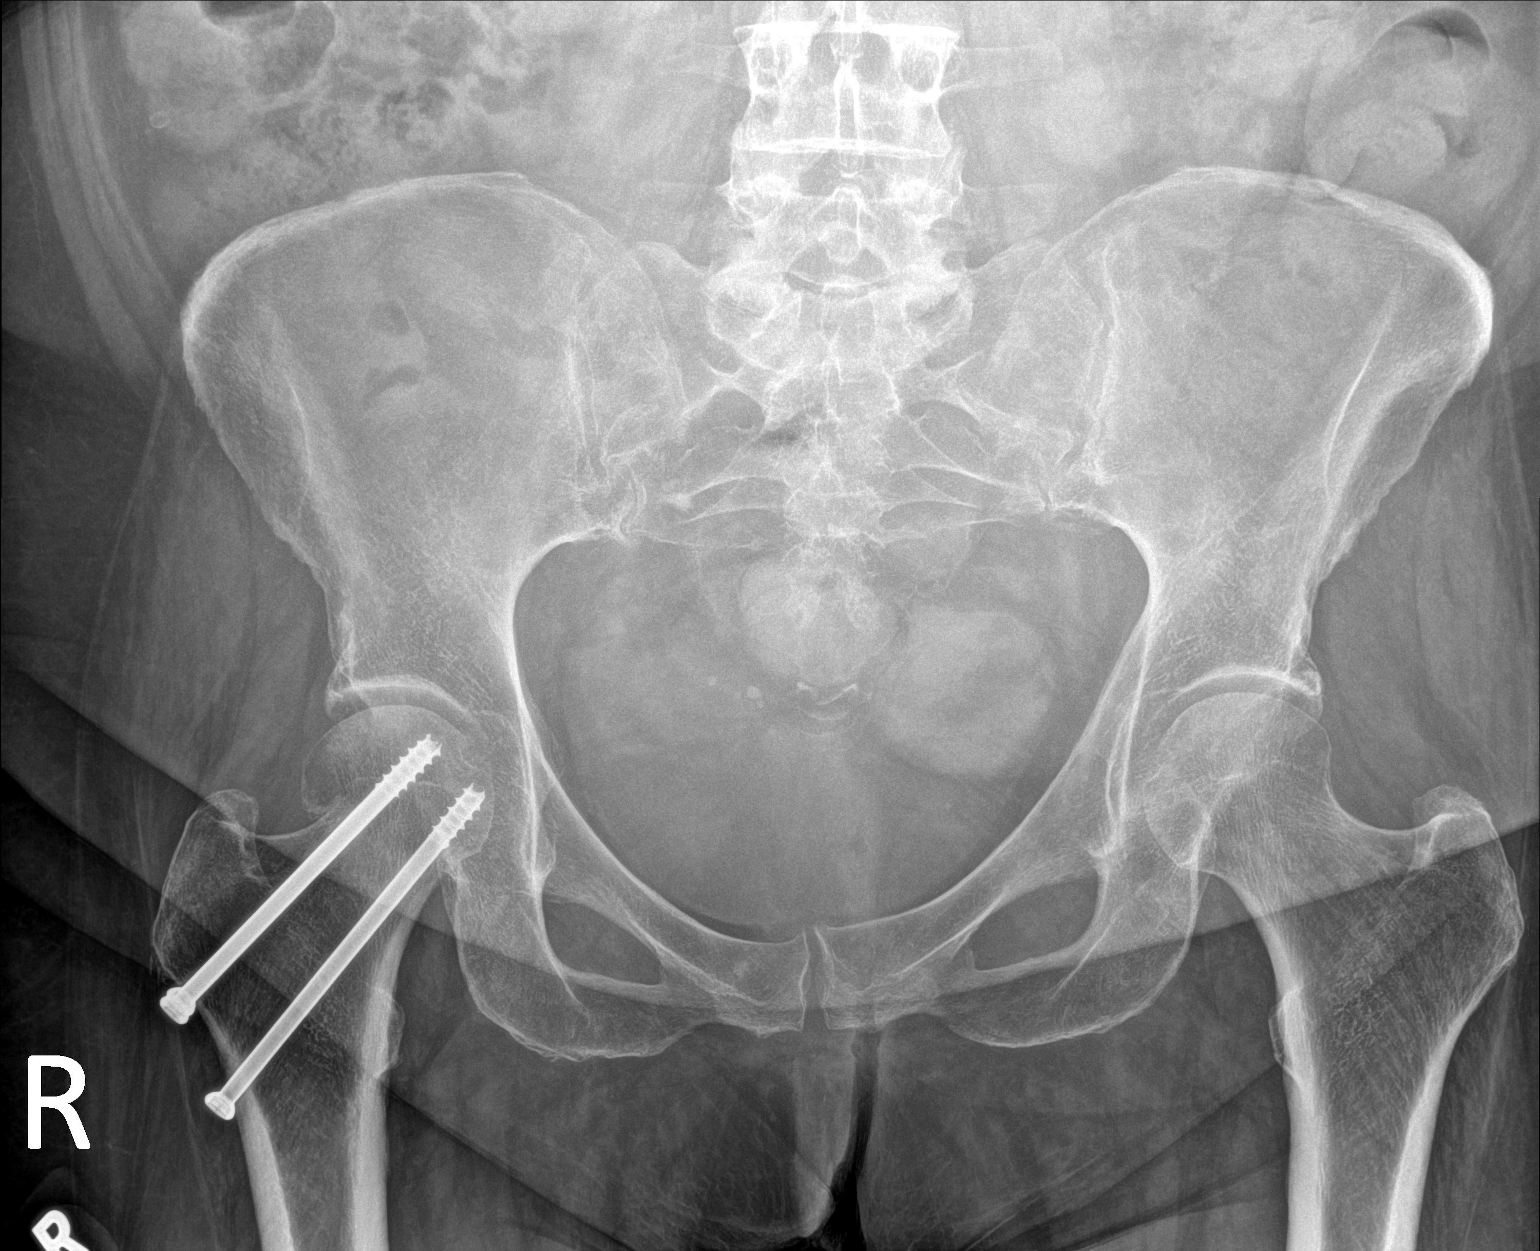

[hip ap]
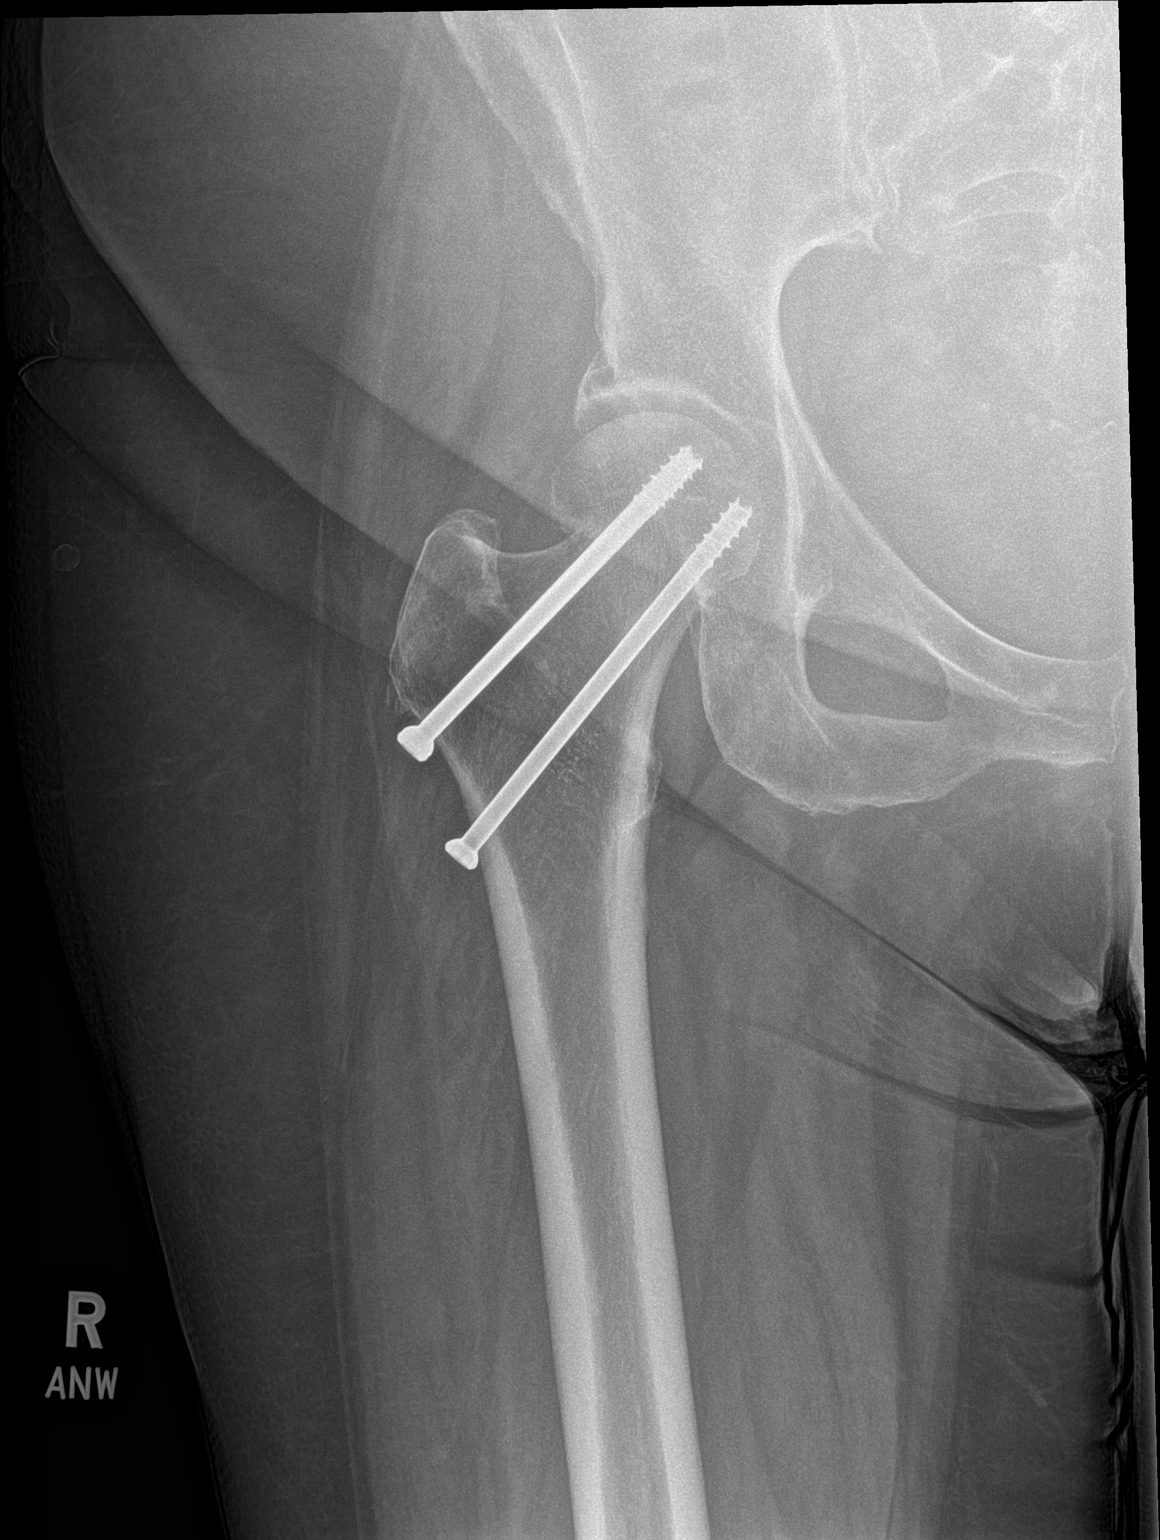

[hip frog leg]
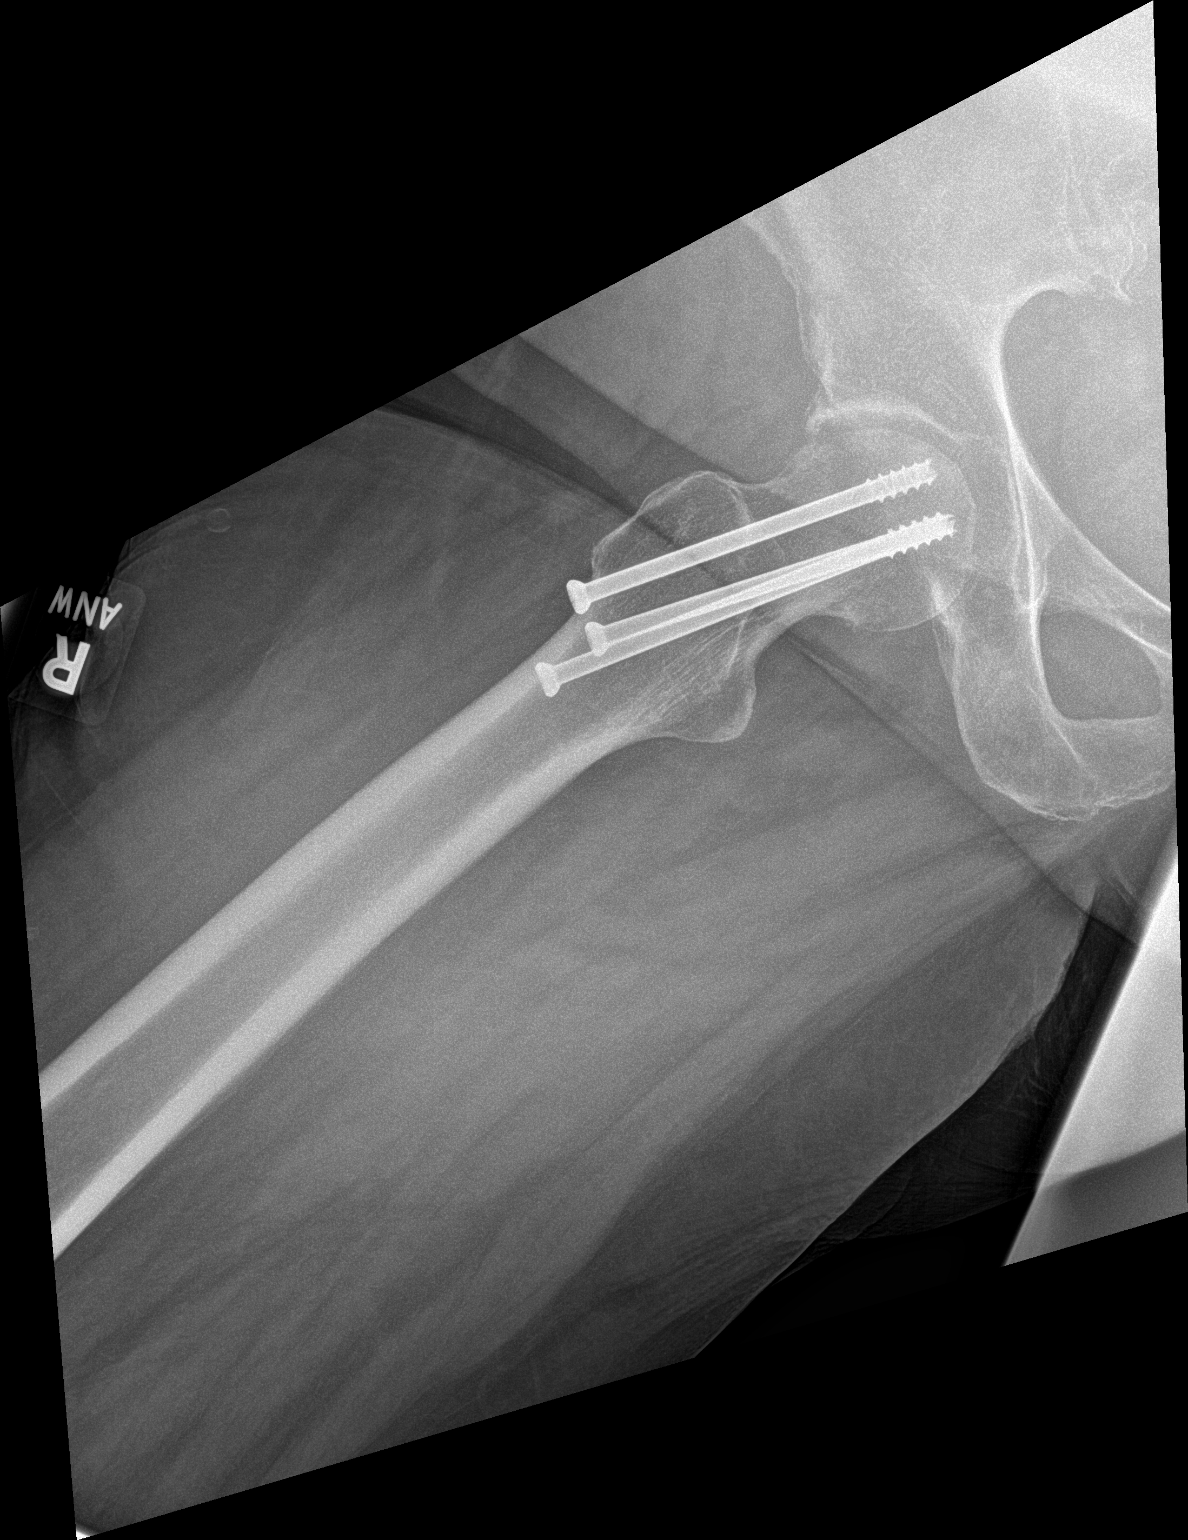

[3 of 3 positions shown; findings below may reference images not displayed]

FINDINGS: Unchanged appearance of the proximal right femur with screw fixation
of a persistently angulated subcapital fracture of the right femoral
neck. No other evidence of fracture. The hip joint spaces are
preserved. Nonobstructive pattern of overlying bowel gas.
IMPRESSION: Unchanged appearance of the proximal right femur with screw fixation
of a persistently angulated subcapital fracture of the right femoral
neck.

## 2022-09-23 ENCOUNTER — Ambulatory Visit (INDEPENDENT_AMBULATORY_CARE_PROVIDER_SITE_OTHER): Payer: Medicare PPO | Admitting: Orthopaedic Surgery

## 2022-09-23 ENCOUNTER — Other Ambulatory Visit (HOSPITAL_BASED_OUTPATIENT_CLINIC_OR_DEPARTMENT_OTHER): Payer: Self-pay

## 2022-09-23 DIAGNOSIS — S42294A Other nondisplaced fracture of upper end of right humerus, initial encounter for closed fracture: Secondary | ICD-10-CM | POA: Diagnosis not present

## 2022-09-23 MED ORDER — HYDROCODONE-ACETAMINOPHEN 5-325 MG PO TABS
1.0000 | ORAL_TABLET | Freq: Four times a day (QID) | ORAL | 0 refills | Status: DC | PRN
  Filled 2022-09-23: qty 30, 8d supply, fill #0

## 2022-09-23 NOTE — Progress Notes (Signed)
Follow-up evaluation     Interval History:  Veronica Cortez presents today with a right minimally displaced proximal humerus fracture after a fall from standing.  She states that her cat tripped her.  This was the previous weekend.  She was seen in the emergency room placed in a sling.  She has been nonweightbearing.  She is here today for further follow-up.  She has been in fairly significant pain with limited ability to sleep.   PMH/PSH/Family History/Social History/Meds/Allergies:    Past Medical History:  Diagnosis Date   Asthma    Depression    Diabetes mellitus without complication (HCC)    Fibromyalgia    GERD (gastroesophageal reflux disease)    Hypertension    IBD (inflammatory bowel disease)    Migraine    Neuropathy    Sleep apnea    Past Surgical History:  Procedure Laterality Date   ABDOMINAL HYSTERECTOMY     BACK SURGERY  2013   ENDOSCOPIC PLANTAR FASCIOTOMY     HAND SURGERY     HIP PINNING,CANNULATED Right 10/18/2021   Procedure: CANNULATED HIP PINNING;  Surgeon: Vanetta Mulders, MD;  Location: [facility];  Service: Orthopedics;  Laterality: Right;   TUBAL LIGATION     Social History   Socioeconomic History   Marital status: Single    Spouse name: Not on file   Number of children: Not on file   Years of education: Not on file   Highest education level: Not on file  Occupational History   Not on file  Tobacco Use   Smoking status: Former    Types: Cigarettes    Quit date: 2012    Years since quitting: 12.1   Smokeless tobacco: Never  Vaping Use   Vaping Use: Never used  Substance and Sexual Activity   Alcohol use: Yes    Alcohol/week: 2.0 standard drinks of alcohol    Types: 2 Glasses of wine per week   Drug use: Never   Sexual activity: Not on file  Other Topics Concern   Not on file  Social History Narrative   Not on file   Social Determinants of Health   Financial Resource Strain: Not on file  Food Insecurity:  Not on file  Transportation Needs: Not on file  Physical Activity: Not on file  Stress: Not on file  Social Connections: Not on file   Family History  Problem Relation Age of Onset   Breast cancer Mother    Kidney failure Father    Allergies  Allergen Reactions   Acetazolamide Other (See Comments)    unknown   Benadryl [Diphenhydramine] Other (See Comments)    unknown   Clarithromycin Other (See Comments)    unknown   Compazine [Prochlorperazine] Other (See Comments)    unknown   Fentanyl Other (See Comments)    unknown   Lamictal [Lamotrigine] Other (See Comments)    unknown   Other Other (See Comments)    Triptans and statins; unknown   Reglan [Metoclopramide] Other (See Comments)    unknown   Topiramate Other (See Comments)    unknown   Current Outpatient Medications  Medication Sig Dispense Refill   HYDROcodone-acetaminophen (NORCO/VICODIN) 5-325 MG tablet Take 1 tablet by mouth every 6 (six) hours as needed for moderate pain. 30 tablet 0   ADVAIR HFA 115-21  MCG/ACT inhaler Inhale 2 puffs into the lungs 2 (two) times daily.     AJOVY 225 MG/1.5ML SOAJ Inject 1.5 mLs into the skin every 30 (thirty) days.     albuterol (VENTOLIN HFA) 108 (90 Base) MCG/ACT inhaler Inhale 2 puffs into the lungs every 4 (four) hours as needed for wheezing or shortness of breath.     amitriptyline (ELAVIL) 100 MG tablet Take 100 mg by mouth at bedtime.     aspirin-acetaminophen-caffeine (EXCEDRIN MIGRAINE) 250-250-65 MG tablet Take 2 tablets by mouth every 6 (six) hours as needed for headache.     atenolol (TENORMIN) 25 MG tablet Take 25 mg by mouth daily.     dicyclomine (BENTYL) 10 MG capsule Take 1 capsule (10 mg total) by mouth 3 (three) times daily before meals. 90 capsule 0   enoxaparin (LOVENOX) 40 MG/0.4ML injection Inject 0.4 mLs (40 mg total) into the skin daily for 14 days. 5.6 mL 0   FLUoxetine (PROZAC) 40 MG capsule Take 40 mg by mouth daily.     HYDROcodone-acetaminophen  (NORCO/VICODIN) 5-325 MG tablet Take 1 tablet by mouth every 6 (six) hours as needed for moderate pain. 30 tablet 0   Insulin Aspart FlexPen (NOVOLOG) 100 UNIT/ML Inject 10-16 Units into the skin 3 (three) times daily.     JANUVIA 100 MG tablet Take 100 mg by mouth daily.     LANTUS SOLOSTAR 100 UNIT/ML Solostar Pen Inject 30 Units into the skin at bedtime.     Melatonin 10 MG TABS Take 10 mg by mouth at bedtime.     montelukast (SINGULAIR) 10 MG tablet Take 10 mg by mouth daily.     Omega-3 Fatty Acids (FISH OIL PO) Take 1 capsule by mouth daily.     omeprazole (PRILOSEC) 20 MG capsule Take 20 mg by mouth 2 (two) times daily.     ondansetron (ZOFRAN-ODT) 8 MG disintegrating tablet Take 1 tablet (8 mg total) by mouth every 8 (eight) hours as needed for nausea or vomiting. 20 tablet 0   oxyCODONE-acetaminophen (PERCOCET/ROXICET) 5-325 MG tablet Take 1 tablet by mouth every 6 (six) hours as needed for severe pain. 15 tablet 0   Probiotic Product (PROBIOTIC PO) Take 1 capsule by mouth daily.     promethazine (PHENERGAN) 25 MG tablet Take 25 mg by mouth daily as needed for nausea/vomiting.     Riboflavin (B2 PO) Take 1 tablet by mouth daily.     rosuvastatin (CRESTOR) 10 MG tablet Take 10 mg by mouth at bedtime.     topiramate (TOPAMAX) 200 MG tablet Take 200 mg by mouth 2 (two) times daily.     UBRELVY 100 MG TABS Take 100 mg by mouth 2 (two) times daily as needed for migraine.     VITAMIN E PO Take 1 tablet by mouth daily.     Current Facility-Administered Medications  Medication Dose Route Frequency Provider Last Rate Last Admin   acetaminophen (TYLENOL) tablet 1,000 mg  1,000 mg Oral Q8H Brimage, Vondra, DO       No results found.  Review of Systems:   A ROS was performed including pertinent positives and negatives as documented in the HPI.   Musculoskeletal Exam:    There were no vitals taken for this visit.  Right shoulder is tender to palpation about the proximal humerus.   Sensation is intact in all distributions of the right upper extremity.  She able to fire EPL as well as wrist flexors and extensors.  2+ radial  pulse  Imaging:    X-ray right shoulder 3 views: Nondisplaced humeral neck fracture  I personally reviewed and interpreted the radiographs.   Assessment:   52 year old female with a right nondisplaced humeral neck fracture after a fall from standing.  Overall the alignment is very excellent.  Side effect I do believe she would do quite well with conservative management.  At this time we will plan for 4 additional weeks in a sling without active or passive range of motion.  I will see her back in 4 weeks.  In 4 weeks for 1.  I will plan to advance her motion at that time.  She was placed in a more comfortable sling today for stabilization.  I will prescribe her an additional Norco prescription  Plan :    -Return to clinic 1 month     I personally saw and evaluated the patient, and participated in the management and treatment plan.  Vanetta Mulders, MD Attending Physician, Orthopedic Surgery  This document was dictated using Dragon voice recognition software. A reasonable attempt at proof reading has been made to minimize errors.

## 2022-10-01 ENCOUNTER — Other Ambulatory Visit (HOSPITAL_COMMUNITY): Payer: Self-pay

## 2022-10-01 ENCOUNTER — Encounter (HOSPITAL_COMMUNITY): Payer: Self-pay

## 2022-10-01 ENCOUNTER — Other Ambulatory Visit (HOSPITAL_BASED_OUTPATIENT_CLINIC_OR_DEPARTMENT_OTHER): Payer: Self-pay | Admitting: Orthopaedic Surgery

## 2022-10-01 ENCOUNTER — Other Ambulatory Visit: Payer: Self-pay

## 2022-10-01 MED ORDER — HYDROCODONE-ACETAMINOPHEN 5-325 MG PO TABS
1.0000 | ORAL_TABLET | Freq: Four times a day (QID) | ORAL | 0 refills | Status: DC | PRN
  Filled 2022-10-01 – 2022-10-02 (×2): qty 30, 8d supply, fill #0

## 2022-10-02 ENCOUNTER — Other Ambulatory Visit: Payer: Self-pay

## 2022-10-02 ENCOUNTER — Encounter: Payer: Self-pay | Admitting: Sports Medicine

## 2022-10-02 ENCOUNTER — Ambulatory Visit (INDEPENDENT_AMBULATORY_CARE_PROVIDER_SITE_OTHER): Payer: Medicare PPO | Admitting: Sports Medicine

## 2022-10-02 ENCOUNTER — Other Ambulatory Visit (HOSPITAL_COMMUNITY): Payer: Self-pay

## 2022-10-02 DIAGNOSIS — Z90710 Acquired absence of both cervix and uterus: Secondary | ICD-10-CM

## 2022-10-02 DIAGNOSIS — Z8781 Personal history of (healed) traumatic fracture: Secondary | ICD-10-CM | POA: Diagnosis not present

## 2022-10-02 DIAGNOSIS — S42294A Other nondisplaced fracture of upper end of right humerus, initial encounter for closed fracture: Secondary | ICD-10-CM

## 2022-10-02 DIAGNOSIS — M859 Disorder of bone density and structure, unspecified: Secondary | ICD-10-CM

## 2022-10-02 DIAGNOSIS — W1789XA Other fall from one level to another, initial encounter: Secondary | ICD-10-CM | POA: Diagnosis not present

## 2022-10-02 DIAGNOSIS — E28319 Asymptomatic premature menopause: Secondary | ICD-10-CM

## 2022-10-02 NOTE — Progress Notes (Signed)
Veronica Cortez - 52 y.o. female MRN [id number]  Date of birth: 1971-05-06  Office Visit Note: Visit Date: 10/02/2022 PCP: Roselee Nova, MD Referred by: Vanetta Mulders, MD  Subjective: No chief complaint on file.  HPI: Veronica Cortez is a pleasant 52 y.o. female who presents today for evaluation of low bone mineral density with history of recurrent fractures from standing height, fragility fractures and early menopause.  History of fractures: -Proximal humerus fracture 3 weeks ago from tripping over her cat; history of right femur fracture in March and 2023 from fall from standing height; history of right ankle fracture in 2022 from outside facility from an inversion ankle injury  -Patient underwent total hysterectomy in 2010, stopped having all menstrual cycles at that time.  Age of last menstrual cycle was around 61-37 years of age.  She did hormonal therapy for only about 4-5 months but then stopped due to side effects.  -She does not take calcium or vitamin D supplementation  -Smoking history: She smoked 1 pack a day since age 2, quit 10 years ago.  Has about a 25-pack-year history.  -Exercise: She does walk around the block and does yoga about 2-3 days/week.  Has not been able to do this recently secondary to femur fracture.  -Medications: She has been on omeprazole 20 mg twice daily for over 25 years due to her uncontrolled GERD and IBS.  She is a type II diabetic, last A1c was 9.4.  She has no history of thyroid disorder.  She never has had a DEXA scan today.  Pertinent ROS were reviewed with the patient and found to be negative unless otherwise specified above in HPI.   Assessment & Plan: Visit Diagnoses:  1. Low bone density   2. Other closed nondisplaced fracture of proximal end of right humerus, initial encounter   3. History of femur fracture   4. Injury resulting from fall from height   5. Early menopause occurring in patient age younger than 36 years    31. History of total hysterectomy    Plan: Discussed with Shoko her evaluation for low bone mineral density given her recurrent fractures, fragility fractures from standing height.  She does have an early history of early onset menopause from a total hysterectomy when she was about 97-38.  Discussed other risk factors such as her prior smoking history, continues PPI use.  We will obtain labs: CBC, CMP, vitamin D, TSH, PTH -she will get these drawn at [facility].  We will order a DEXA scan to evaluate bone density further.  She will follow-up with me in 3 days following this to discuss next steps.  Follow-up: Return for call to make an appointment 3 days after getting DEXA scan.   Meds & Orders: No orders of the defined types were placed in this encounter.   Orders Placed This Encounter  Procedures   DG BONE DENSITY (DXA)   CBC with Differential   Comprehensive Metabolic Panel (CMET)   Vitamin D (25 hydroxy)   TSH   Parathyroid hormone, intact (no Ca)     Procedures: No procedures performed      Clinical History: No specialty comments available.  She reports that she quit smoking about 12 years ago. Her smoking use included cigarettes. She has never used smokeless tobacco.  Recent Labs    10/19/21 0040  HGBA1C 9.4*    Objective:    Physical Exam  Gen: Well-appearing, in no acute distress; non-toxic CV: Well-perfused. Warm.  Resp: Breathing unlabored on room air; no wheezing. Psych: Fluid speech in conversation; appropriate affect; normal thought process Neuro: Sensation intact throughout. No gross coordination deficits.   Ortho Exam - Right shoulder: Right shoulder is immobilized  Shoulder sling given her proximal humerus fracture.  Able to Labour 5 fingers without difficulty.  No thyromegaly.  Imaging:  DG Shoulder Right CLINICAL DATA:  pain  EXAM: RIGHT SHOULDER - 2+ VIEW  COMPARISON:  None Available.  FINDINGS: Comminuted acute humeral neck fracture. No  dislocation. There is no evidence of arthropathy or other focal bone abnormality. Soft tissues are unremarkable.  IMPRESSION: Comminuted acute humeral neck fracture.  Electronically Signed   By: Iven Finn M.D.   On: 09/13/2022 19:25    Past Medical/Family/Surgical/Social History: Medications & Allergies reviewed per EMR, new medications updated. Patient Active Problem List   Diagnosis Date Noted   Femoral fracture ([place]) 10/18/2021   Closed subcapital fracture of right femur ([place])    Fall    Hyperglycemia due to diabetes mellitus ([place])    Acute hemorrhagic colitis 04/16/2021   Insulin-requiring or dependent type II diabetes mellitus ([place])    Asthma    Essential hypertension    Depression    Past Medical History:  Diagnosis Date   Asthma    Depression    Diabetes mellitus without complication (HCC)    Fibromyalgia    GERD (gastroesophageal reflux disease)    Hypertension    IBD (inflammatory bowel disease)    Migraine    Neuropathy    Sleep apnea    Family History  Problem Relation Age of Onset   Breast cancer Mother    Kidney failure Father    Past Surgical History:  Procedure Laterality Date   ABDOMINAL HYSTERECTOMY     BACK SURGERY  2013   ENDOSCOPIC PLANTAR FASCIOTOMY     HAND SURGERY     HIP PINNING,CANNULATED Right 10/18/2021   Procedure: CANNULATED HIP PINNING;  Surgeon: Vanetta Mulders, MD;  Location: [facility];  Service: Orthopedics;  Laterality: Right;   TUBAL LIGATION     Social History   Occupational History   Not on file  Tobacco Use   Smoking status: Former    Types: Cigarettes    Quit date: 2012    Years since quitting: 12.1   Smokeless tobacco: Never  Vaping Use   Vaping Use: Never used  Substance and Sexual Activity   Alcohol use: Yes    Alcohol/week: 2.0 standard drinks of alcohol    Types: 2 Glasses of wine per week   Drug use: Never   Sexual activity: Not on file

## 2022-10-02 NOTE — Progress Notes (Signed)
Here today for referral for bone density Dr. Sammuel Hines referral due to multiple fractures over the last few years.

## 2022-10-03 LAB — COMPREHENSIVE METABOLIC PANEL
AG Ratio: 1.5 (calc) (ref 1.0–2.5)
ALT: 13 U/L (ref 6–29)
AST: 13 U/L (ref 10–35)
Albumin: 4.7 g/dL (ref 3.6–5.1)
Alkaline phosphatase (APISO): 176 U/L — ABNORMAL HIGH (ref 37–153)
BUN: 12 mg/dL (ref 7–25)
CO2: 18 mmol/L — ABNORMAL LOW (ref 20–32)
Calcium: 10.4 mg/dL (ref 8.6–10.4)
Chloride: 108 mmol/L (ref 98–110)
Creat: 0.53 mg/dL (ref 0.50–1.03)
Globulin: 3.1 g/dL (calc) (ref 1.9–3.7)
Glucose, Bld: 124 mg/dL (ref 65–139)
Potassium: 3.9 mmol/L (ref 3.5–5.3)
Sodium: 140 mmol/L (ref 135–146)
Total Bilirubin: 0.2 mg/dL (ref 0.2–1.2)
Total Protein: 7.8 g/dL (ref 6.1–8.1)

## 2022-10-03 LAB — CBC WITH DIFFERENTIAL/PLATELET
Absolute Monocytes: 824 {cells}/uL (ref 200–950)
Basophils Absolute: 104 {cells}/uL (ref 0–200)
Basophils Relative: 0.9 %
Eosinophils Absolute: 255 {cells}/uL (ref 15–500)
Eosinophils Relative: 2.2 %
HCT: 39.7 % (ref 35.0–45.0)
Hemoglobin: 12.7 g/dL (ref 11.7–15.5)
Lymphs Abs: 3260 {cells}/uL (ref 850–3900)
MCH: 26.3 pg — ABNORMAL LOW (ref 27.0–33.0)
MCHC: 32 g/dL (ref 32.0–36.0)
MCV: 82.2 fL (ref 80.0–100.0)
MPV: 10.6 fL (ref 7.5–12.5)
Monocytes Relative: 7.1 %
Neutro Abs: 7157 {cells}/uL (ref 1500–7800)
Neutrophils Relative %: 61.7 %
Platelets: 542 Thousand/uL — ABNORMAL HIGH (ref 140–400)
RBC: 4.83 Million/uL (ref 3.80–5.10)
RDW: 13.4 % (ref 11.0–15.0)
Total Lymphocyte: 28.1 %
WBC: 11.6 Thousand/uL — ABNORMAL HIGH (ref 3.8–10.8)

## 2022-10-03 LAB — TSH: TSH: 1.18 m[IU]/L

## 2022-10-03 LAB — PARATHYROID HORMONE, INTACT (NO CA): PTH: 27 pg/mL (ref 16–77)

## 2022-10-03 LAB — VITAMIN D 25 HYDROXY (VIT D DEFICIENCY, FRACTURES): Vit D, 25-Hydroxy: 17 ng/mL — ABNORMAL LOW (ref 30–100)

## 2022-10-05 ENCOUNTER — Other Ambulatory Visit: Payer: Self-pay

## 2022-10-07 ENCOUNTER — Other Ambulatory Visit: Payer: Self-pay | Admitting: Sports Medicine

## 2022-10-07 DIAGNOSIS — E559 Vitamin D deficiency, unspecified: Secondary | ICD-10-CM

## 2022-10-07 MED ORDER — VITAMIN D (ERGOCALCIFEROL) 1.25 MG (50000 UNIT) PO CAPS: 50000.0000 [IU] | ORAL_CAPSULE | ORAL | 0 refills | Status: AC

## 2022-10-07 NOTE — Progress Notes (Signed)
Rx for Vit D sent.

## 2022-10-13 ENCOUNTER — Other Ambulatory Visit: Payer: Self-pay | Admitting: Family Medicine

## 2022-10-13 DIAGNOSIS — Z1231 Encounter for screening mammogram for malignant neoplasm of breast: Secondary | ICD-10-CM

## 2022-10-21 ENCOUNTER — Ambulatory Visit (INDEPENDENT_AMBULATORY_CARE_PROVIDER_SITE_OTHER): Payer: Medicare PPO

## 2022-10-21 ENCOUNTER — Ambulatory Visit (INDEPENDENT_AMBULATORY_CARE_PROVIDER_SITE_OTHER): Payer: Medicare PPO | Admitting: Orthopaedic Surgery

## 2022-10-21 DIAGNOSIS — S42294A Other nondisplaced fracture of upper end of right humerus, initial encounter for closed fracture: Secondary | ICD-10-CM

## 2022-10-21 NOTE — Progress Notes (Signed)
Follow-up evaluation     Interval History:   10/21/2022: Presents today for follow-up of her right shoulder.  She is overall feeling much better.  Her pain is much better controlled.  She has been compliant with sling usage.  Veronica Cortez presents today with a right minimally displaced proximal humerus fracture after a fall from standing.  She states that her cat tripped her.  This was the previous weekend.  She was seen in the emergency room placed in a sling.  She has been nonweightbearing.  She is here today for further follow-up.  She has been in fairly significant pain with limited ability to sleep.   PMH/PSH/Family History/Social History/Meds/Allergies:    Past Medical History:  Diagnosis Date   Asthma    Depression    Diabetes mellitus without complication (HCC)    Fibromyalgia    GERD (gastroesophageal reflux disease)    Hypertension    IBD (inflammatory bowel disease)    Migraine    Neuropathy    Sleep apnea    Past Surgical History:  Procedure Laterality Date   ABDOMINAL HYSTERECTOMY     BACK SURGERY  2013   ENDOSCOPIC PLANTAR FASCIOTOMY     HAND SURGERY     HIP PINNING,CANNULATED Right 10/18/2021   Procedure: CANNULATED HIP PINNING;  Surgeon: Vanetta Mulders, MD;  Location: [facility];  Service: Orthopedics;  Laterality: Right;   TUBAL LIGATION     Social History   Socioeconomic History   Marital status: Single    Spouse name: Not on file   Number of children: Not on file   Years of education: Not on file   Highest education level: Not on file  Occupational History   Not on file  Tobacco Use   Smoking status: Former    Types: Cigarettes    Quit date: 2012    Years since quitting: 12.2   Smokeless tobacco: Never  Vaping Use   Vaping Use: Never used  Substance and Sexual Activity   Alcohol use: Yes    Alcohol/week: 2.0 standard drinks of alcohol    Types: 2 Glasses of wine per week   Drug use: Never   Sexual activity: Not  on file  Other Topics Concern   Not on file  Social History Narrative   Not on file   Social Determinants of Health   Financial Resource Strain: Not on file  Food Insecurity: Not on file  Transportation Needs: Not on file  Physical Activity: Not on file  Stress: Not on file  Social Connections: Not on file   Family History  Problem Relation Age of Onset   Breast cancer Mother    Kidney failure Father    Allergies  Allergen Reactions   Acetazolamide Other (See Comments)    unknown   Benadryl [Diphenhydramine] Other (See Comments)    unknown   Clarithromycin Other (See Comments)    unknown   Compazine [Prochlorperazine] Other (See Comments)    unknown   Fentanyl Other (See Comments)    unknown   Lamictal [Lamotrigine] Other (See Comments)    unknown   Other Other (See Comments)    Triptans and statins; unknown   Reglan [Metoclopramide] Other (See Comments)    unknown   Topiramate Other (See Comments)    unknown   Current Outpatient Medications  Medication Sig Dispense Refill   ADVAIR HFA 115-21 MCG/ACT inhaler Inhale 2 puffs into the lungs 2 (two) times daily.     AJOVY 225 MG/1.5ML SOAJ Inject 1.5 mLs into the skin every 30 (thirty) days.     albuterol (VENTOLIN HFA) 108 (90 Base) MCG/ACT inhaler Inhale 2 puffs into the lungs every 4 (four) hours as needed for wheezing or shortness of breath.     amitriptyline (ELAVIL) 100 MG tablet Take 100 mg by mouth at bedtime.     aspirin-acetaminophen-caffeine (EXCEDRIN MIGRAINE) 250-250-65 MG tablet Take 2 tablets by mouth every 6 (six) hours as needed for headache.     atenolol (TENORMIN) 25 MG tablet Take 25 mg by mouth daily.     dicyclomine (BENTYL) 10 MG capsule Take 1 capsule (10 mg total) by mouth 3 (three) times daily before meals. 90 capsule 0   enoxaparin (LOVENOX) 40 MG/0.4ML injection Inject 0.4 mLs (40 mg total) into the skin daily for 14 days. 5.6 mL 0   FLUoxetine (PROZAC) 40 MG capsule Take 40 mg by mouth  daily.     HYDROcodone-acetaminophen (NORCO/VICODIN) 5-325 MG tablet Take 1 tablet by mouth every 6 (six) hours as needed for moderate pain. 30 tablet 0   HYDROcodone-acetaminophen (NORCO/VICODIN) 5-325 MG tablet Take 1 tablet by mouth every 6 (six) hours as needed for moderate pain. 30 tablet 0   Insulin Aspart FlexPen (NOVOLOG) 100 UNIT/ML Inject 10-16 Units into the skin 3 (three) times daily.     JANUVIA 100 MG tablet Take 100 mg by mouth daily.     LANTUS SOLOSTAR 100 UNIT/ML Solostar Pen Inject 30 Units into the skin at bedtime.     Melatonin 10 MG TABS Take 10 mg by mouth at bedtime.     montelukast (SINGULAIR) 10 MG tablet Take 10 mg by mouth daily.     Omega-3 Fatty Acids (FISH OIL PO) Take 1 capsule by mouth daily.     omeprazole (PRILOSEC) 20 MG capsule Take 20 mg by mouth 2 (two) times daily.     ondansetron (ZOFRAN-ODT) 8 MG disintegrating tablet Take 1 tablet (8 mg total) by mouth every 8 (eight) hours as needed for nausea or vomiting. 20 tablet 0   oxyCODONE-acetaminophen (PERCOCET/ROXICET) 5-325 MG tablet Take 1 tablet by mouth every 6 (six) hours as needed for severe pain. 15 tablet 0   Probiotic Product (PROBIOTIC PO) Take 1 capsule by mouth daily.     promethazine (PHENERGAN) 25 MG tablet Take 25 mg by mouth daily as needed for nausea/vomiting.     Riboflavin (B2 PO) Take 1 tablet by mouth daily.     rosuvastatin (CRESTOR) 10 MG tablet Take 10 mg by mouth at bedtime.     topiramate (TOPAMAX) 200 MG tablet Take 200 mg by mouth 2 (two) times daily.     UBRELVY 100 MG TABS Take 100 mg by mouth 2 (two) times daily as needed for migraine.     Vitamin D, Ergocalciferol, (DRISDOL) 1.25 MG (50000 UNIT) CAPS capsule Take 1 capsule (50,000 Units total) by mouth every 7 (seven) days for 8 doses. 8 capsule 0   VITAMIN E PO Take 1 tablet by mouth daily.     Current Facility-Administered Medications  Medication Dose Route Frequency Provider Last Rate Last Admin   acetaminophen (TYLENOL)  tablet 1,000 mg  1,000 mg Oral Q8H Brimage, Vondra, DO       No results found.  Review of Systems:   A ROS was performed including  pertinent positives and negatives as documented in the HPI.   Musculoskeletal Exam:    There were no vitals taken for this visit.  Right shoulder is minimally tender to palpation.  Range of motion is moving as a unit passive forward elevation is to 100 degrees with external rotation at side to 40 degrees..  Sensation is intact in all distributions of the right upper extremity.  She able to fire EPL as well as wrist flexors and extensors.  2+ radial pulse  Imaging:    X-ray right shoulder 3 views: Nondisplaced humeral neck fracture with interval callus formation  I personally reviewed and interpreted the radiographs.   Assessment:   52 year old female with a right nondisplaced humeral neck fracture after a fall from standing.  She has interval callus formation.  Given the fact that she is healing well I would like her to work on active and passive range of motion at this point.  Will plan for physical therapy for range of motion.  She will not do any heavy lifting until her next appointment in 6 weeks  Plan :    -Return to clinic 6 weeks     I personally saw and evaluated the patient, and participated in the management and treatment plan.  Vanetta Mulders, MD Attending Physician, Orthopedic Surgery  This document was dictated using Dragon voice recognition software. A reasonable attempt at proof reading has been made to minimize errors.

## 2022-10-22 ENCOUNTER — Encounter: Payer: Self-pay | Admitting: Sports Medicine

## 2022-11-09 ENCOUNTER — Ambulatory Visit
Admission: RE | Admit: 2022-11-09 | Discharge: 2022-11-09 | Disposition: A | Discharge status: Home / Self Care | Payer: Medicare PPO | Source: Ambulatory Visit | Attending: Family Medicine | Admitting: Family Medicine

## 2022-11-09 DIAGNOSIS — Z1231 Encounter for screening mammogram for malignant neoplasm of breast: Secondary | ICD-10-CM

## 2022-11-09 NOTE — Therapy (Unsigned)
OUTPATIENT PHYSICAL THERAPY SHOULDER EVALUATION   Patient Name: Veronica Cortez MRN: [id number] DOB:Jan 16, 1971, 52 y.o., female Today's Date: 11/10/2022  END OF SESSION:  PT End of Session - 11/10/22 1301     Visit Number 1    Number of Visits 12    Date for PT Re-Evaluation 01/05/23    Authorization Type [employer] MCR    PT Start Time 1215    PT Stop Time 1300    PT Time Calculation (min) 45 min    Activity Tolerance Patient tolerated treatment well    Behavior During Therapy WFL for tasks assessed/performed             Past Medical History:  Diagnosis Date   Asthma    Depression    Diabetes mellitus without complication    Fibromyalgia    GERD (gastroesophageal reflux disease)    Hypertension    IBD (inflammatory bowel disease)    Migraine    Neuropathy    Sleep apnea    Past Surgical History:  Procedure Laterality Date   ABDOMINAL HYSTERECTOMY     BACK SURGERY  2013   ENDOSCOPIC PLANTAR FASCIOTOMY     HAND SURGERY     HIP PINNING,CANNULATED Right 10/18/2021   Procedure: CANNULATED HIP PINNING;  Surgeon: Vanetta Mulders, MD;  Location: [facility];  Service: Orthopedics;  Laterality: Right;   TUBAL LIGATION     Patient Active Problem List   Diagnosis Date Noted   Femoral fracture 10/18/2021   Closed subcapital fracture of right femur    Fall    Hyperglycemia due to diabetes mellitus    Acute hemorrhagic colitis 04/16/2021   Insulin-requiring or dependent type II diabetes mellitus    Asthma    Essential hypertension    Depression     PCP: Roselee Nova, MD   REFERRING PROVIDER: Vanetta Mulders, MD   REFERRING DIAG: [phone] (ICD-10-CM) - Other closed nondisplaced fracture of proximal end of right humerus, initial encounter   THERAPY DIAG:  Muscle weakness (generalized)  Abnormal posture  Decreased ROM of right shoulder  Rationale for Evaluation and Treatment: Rehabilitation  ONSET DATE: 10/02/22   SUBJECTIVE:                                                                                                                                                                                       SUBJECTIVE STATEMENT: 10/21/2022: Presents today for follow-up of her right shoulder.  She is overall feeling much better.  Her pain is much better controlled.  She has been compliant with sling usage.   Veronica Cortez presents today with a right minimally displaced proximal humerus fracture after  a fall from standing.  She states that her cat tripped her.  This was the previous weekend.  She was seen in the emergency room placed in a sling.  She has been nonweightbearing.  She is here today for further follow-up.  She has been in fairly significant pain with limited ability to sleep. Hand dominance: Right  PERTINENT HISTORY: 52 year old female with a right nondisplaced humeral neck fracture after a fall from standing.  She has interval callus formation.  Given the fact that she is healing well I would like her to work on active and passive range of motion at this point.  Will plan for physical therapy for range of motion.  She will not do any heavy lifting until her next appointment in 6 weeks  PAIN:  Are you having pain? Yes: NPRS scale: 7/10 Pain location: R shoulder Pain description: ache Aggravating factors: movement and use Relieving factors: rest and tylenol  PRECAUTIONS: Shoulder  WEIGHT BEARING RESTRICTIONS: Yes NWB RUE  FALLS:  Has patient fallen in last 6 months? Yes. Number of falls 1  LIVING ENVIRONMENT: Lives with: lives with their spouse Lives in: House/apartment Stairs:  yes  OCCUPATION: retired  PLOF: Independent  PATIENT GOALS:To regain my shoulder function  NEXT MD VISIT: 12/09/22  OBJECTIVE:   DIAGNOSTIC FINDINGS:  X-ray right shoulder 3 views: Nondisplaced humeral neck fracture with interval callus formation  PATIENT SURVEYS:  FOTO 54(64 predicted)  POSTURE: Rounded and forward shoulders, forward head  UPPER  EXTREMITY ROM:   A/PROM Right eval Left eval  Shoulder flexion 100/117d 160  Shoulder extension 30 45  Shoulder abduction 90/80d 160  Shoulder adduction    Shoulder internal rotation 70d PROM   Shoulder external rotation 40d PROM   Elbow flexion    Elbow extension    Wrist flexion    Wrist extension    Wrist ulnar deviation    Wrist radial deviation    Wrist pronation    Wrist supination    (Blank rows = not tested)  UPPER EXTREMITY MMT:  MMT Right eval Left eval  Shoulder flexion 3 5  Shoulder extension 3 5  Shoulder abduction 3 5  Shoulder adduction    Shoulder internal rotation    Shoulder external rotation    Middle trapezius 4   Lower trapezius 4   Elbow flexion 4   Elbow extension 4   Wrist flexion 4   Wrist extension 4   Wrist ulnar deviation    Wrist radial deviation    Wrist pronation    Wrist supination    Grip strength (lbs) 68 63  (Blank rows = not tested)  SHOULDER SPECIAL TESTS: deferred  JOINT MOBILITY TESTING:  deferred  PALPATION:  unremarkable   TODAY'S TREATMENT:  DATE: Eval and HEP   PATIENT EDUCATION: Education details: Discussed eval findings, rehab rationale and POC and patient is in agreement  Person educated: Patient Education method: Explanation Education comprehension: verbalized understanding  HOME EXERCISE PROGRAM: Access Code: [email or web address] URL: [email or web address] Date: 11/10/2022 Prepared by: Sharlynn Oliphant  Exercises - Seated Shoulder Flexion Towel Slide at Table Top Full Range of Motion  - 3 x daily - 5 x weekly - 1 sets - 10 reps - 3s hold - Seated Shoulder Scaption Slide at Table Top with Forearm in Neutral  - 3 x daily - 5 x weekly - 1 sets - 10 reps - 3s hold - Seated Shoulder Shrugs  - 3 x daily - 5 x weekly - 1 sets - 10 reps - Seated Scapular Retraction  - 3 x  daily - 5 x weekly - 1 sets - 10 reps  ASSESSMENT:  CLINICAL IMPRESSION: Patient is a 52 y.o. female who was seen today for physical therapy evaluation and treatment for R shoulder pain and loss of function following proximal humerus fracture and subsequent immobilization. Pain is controlled with Tylenol and rest.  R wrist/elbow mobility is WNL, grip strength equal B.  Patient presents with motion loss in glenohumeral joint as well as substitution with AROM.  Formal strength testing deferred until MD f/u on 12/09/22 to determine if appropriate.  Periscapular strength and mobility appear good.  OBJECTIVE IMPAIRMENTS: decreased activity tolerance, decreased knowledge of condition, decreased mobility, decreased ROM, decreased strength, increased fascial restrictions, impaired perceived functional ability, impaired UE functional use, postural dysfunction, and pain.   ACTIVITY LIMITATIONS: carrying, lifting, reach over head, and caring for others  PARTICIPATION LIMITATIONS: meal prep, cleaning, laundry, driving, shopping, and community activity  PERSONAL FACTORS: Age, Fitness, Past/current experiences, and Time since onset of injury/illness/exacerbation are also affecting patient's functional outcome.   REHAB POTENTIAL: Good  CLINICAL DECISION MAKING: Stable/uncomplicated  EVALUATION COMPLEXITY: Low   GOALS: Goals reviewed with patient? Yes  SHORT TERM GOALS: Target date: 12/01/2022  Patient to demonstrate independence in HEP  Baseline: [id number] Goal status: INITIAL  2.  120d AROM flexion and abduction R shoulder Baseline:  A/PROM Right eval Left eval  Shoulder flexion 100/117d 160  Shoulder extension 30 45  Shoulder abduction 90/80d 160   Goal status: INITIAL    LONG TERM GOALS: Target date: 12/22/2022    Increase AROM R shoulder to 160d Baseline:  A/PROM Right eval Left eval  Shoulder flexion 100/117d 160  Shoulder extension 30 45  Shoulder abduction 90/80d 160   Goal  status: INITIAL  2.  Increase R shoulder strength to 4/5 when appropriate for strengthening Baseline:  MMT Right eval Left eval  Shoulder flexion 3 5  Shoulder extension 3 5  Shoulder abduction 3 5   Goal status: INITIAL  3.  Resolve R scapular dyskinesis with AROM Baseline: R scapular winging noted with AROM Goal status: INITIAL  4.  Decrease pain to 4/10 at worst Baseline: 7/10 at worst Goal status: INITIAL  5.  Increase FOTO score to 64 Baseline: 54 Goal status: INITIAL    PLAN:  PT FREQUENCY: 2x/week  PT DURATION: 6 weeks  PLANNED INTERVENTIONS: Therapeutic exercises, Therapeutic activity, Neuromuscular re-education, Balance training, Gait training, Patient/Family education, Self Care, Joint mobilization, Dry Needling, Manual therapy, and Re-evaluation  PLAN FOR NEXT SESSION: HEP review  and update, PROM, scapular mobilization and strengthening of periscapular muscles.   Lanice Shirts, PT 11/10/2022, 1:02 PM   Referring diagnosis? R humerus fracture Treatment  diagnosis? (if different than referring diagnosis) Decreased shoulder ROM What was this (referring dx) caused by? []  Surgery [x]  Fall []  Ongoing issue []  Arthritis []  Other: ____________  Laterality: [x]  Rt []  Lt []  Both  Check all possible CPT codes:  *CHOOSE 10 OR LESS*    []  97110 (Therapeutic Exercise)  []  92507 (SLP Treatment)  []  97112 (Neuro Re-ed)   []  92526 (Swallowing Treatment)   []  97116 (Gait Training)   []  [id number] (Cognitive Training, 1st 15 minutes) []  97140 (Manual Therapy)   []  97130 (Cognitive Training, each add'l 15 minutes)  []  97164 (Re-evaluation)                              []  Other, List CPT Code ____________  []  [id number] (Therapeutic Activities)     []  97535 (Self Care)   [x]  All codes above (97110 - 97535)  []  97012 (Mechanical Traction)  []  97014 (E-stim Unattended)  []  97032 (E-stim manual)  []  97033 (Ionto)  []  16109 (Ultrasound) []  97750 (Physical Performance  Training) []  [id number] (Aquatic Therapy) []  97016 (Vasopneumatic Device) []  [id number] (Paraffin) []  97034 (Contrast Bath) []  97597 (Wound Care 1st 20 sq cm) []  97598 (Wound Care each add'l 20 sq cm) []  97760 (Orthotic Fabrication, Fitting, Training Initial) []  [id number] (Prosthetic Management and Training Initial) []  [id number] (Orthotic or Prosthetic Training/ Modification Subsequent)

## 2022-11-10 ENCOUNTER — Ambulatory Visit: Payer: Medicare PPO | Attending: Orthopaedic Surgery

## 2022-11-10 ENCOUNTER — Other Ambulatory Visit: Payer: Self-pay

## 2022-11-10 DIAGNOSIS — M25611 Stiffness of right shoulder, not elsewhere classified: Secondary | ICD-10-CM | POA: Insufficient documentation

## 2022-11-10 DIAGNOSIS — S42294A Other nondisplaced fracture of upper end of right humerus, initial encounter for closed fracture: Secondary | ICD-10-CM | POA: Insufficient documentation

## 2022-11-10 DIAGNOSIS — M6281 Muscle weakness (generalized): Secondary | ICD-10-CM | POA: Diagnosis present

## 2022-11-10 DIAGNOSIS — R293 Abnormal posture: Secondary | ICD-10-CM | POA: Diagnosis present

## 2022-11-12 ENCOUNTER — Ambulatory Visit: Payer: Medicare PPO

## 2022-11-12 DIAGNOSIS — R293 Abnormal posture: Secondary | ICD-10-CM

## 2022-11-12 DIAGNOSIS — M6281 Muscle weakness (generalized): Secondary | ICD-10-CM

## 2022-11-12 DIAGNOSIS — M25611 Stiffness of right shoulder, not elsewhere classified: Secondary | ICD-10-CM

## 2022-11-12 NOTE — Therapy (Signed)
OUTPATIENT PHYSICAL THERAPY TREATMENT NOTE   Patient Name: Veronica Cortez MRN: [id number] DOB:06-02-1971, 52 y.o., female Today's Date: 11/12/2022  PCP: Roselee Nova, MD  REFERRING PROVIDER: Vanetta Mulders, MD   END OF SESSION:   PT End of Session - 11/12/22 1304     Visit Number 2    Number of Visits 12    Date for PT Re-Evaluation 01/05/23    Authorization Type [employer] MCR    PT Start Time [id number]    PT Stop Time [id number]    PT Time Calculation (min) 40 min    Activity Tolerance Patient tolerated treatment well    Behavior During Therapy WFL for tasks assessed/performed             Past Medical History:  Diagnosis Date   Asthma    Depression    Diabetes mellitus without complication    Fibromyalgia    GERD (gastroesophageal reflux disease)    Hypertension    IBD (inflammatory bowel disease)    Migraine    Neuropathy    Sleep apnea    Past Surgical History:  Procedure Laterality Date   ABDOMINAL HYSTERECTOMY     BACK SURGERY  2013   ENDOSCOPIC PLANTAR FASCIOTOMY     HAND SURGERY     HIP PINNING,CANNULATED Right 10/18/2021   Procedure: CANNULATED HIP PINNING;  Surgeon: Vanetta Mulders, MD;  Location: [facility];  Service: Orthopedics;  Laterality: Right;   TUBAL LIGATION     Patient Active Problem List   Diagnosis Date Noted   Femoral fracture 10/18/2021   Closed subcapital fracture of right femur    Fall    Hyperglycemia due to diabetes mellitus    Acute hemorrhagic colitis 04/16/2021   Insulin-requiring or dependent type II diabetes mellitus    Asthma    Essential hypertension    Depression     REFERRING DIAG: S42.294A (ICD-10-CM) - Other closed nondisplaced fracture of proximal end of right humerus, initial encounter   THERAPY DIAG:  Muscle weakness (generalized)  Abnormal posture  Decreased ROM of right shoulder  Rationale for Evaluation and Treatment Rehabilitation  PERTINENT HISTORY: 52 year old female with a right nondisplaced humeral  neck fracture after a fall from standing. She has interval callus formation. Given the fact that she is healing well I would like her to work on active and passive range of motion at this point. Will plan for physical therapy for range of motion. She will not do any heavy lifting until her next appointment in 6 weeks   ONSET DATE: 10/02/22   PRECAUTIONS: Shoulder   SUBJECTIVE:  SUBJECTIVE STATEMENT:  Patient reports continued pain, states she was very sore the day after the eval.    PAIN:  Are you having pain? Yes: NPRS scale: 3-6/10 Pain location: R shoulder Pain description: ache Aggravating factors: movement and use Relieving factors: rest and tylenol   OBJECTIVE: (objective measures completed at initial evaluation unless otherwise dated)   DIAGNOSTIC FINDINGS:  X-ray right shoulder 3 views: Nondisplaced humeral neck fracture with interval callus formation   PATIENT SURVEYS:  FOTO 54(64 predicted)   POSTURE: Rounded and forward shoulders, forward head   UPPER EXTREMITY ROM:    A/PROM Right eval Left eval  Shoulder flexion 100/117d 160  Shoulder extension 30 45  Shoulder abduction 90/80d 160  Shoulder adduction      Shoulder internal rotation 70d PROM    Shoulder external rotation 40d PROM    Elbow flexion      Elbow extension      Wrist flexion      Wrist extension      Wrist ulnar deviation      Wrist radial deviation      Wrist pronation      Wrist supination      (Blank rows = not tested)   UPPER EXTREMITY MMT:   MMT Right eval Left eval  Shoulder flexion 3 5  Shoulder extension 3 5  Shoulder abduction 3 5  Shoulder adduction      Shoulder internal rotation      Shoulder external rotation      Middle trapezius 4    Lower trapezius 4    Elbow flexion 4    Elbow  extension 4    Wrist flexion 4    Wrist extension 4    Wrist ulnar deviation      Wrist radial deviation      Wrist pronation      Wrist supination      Grip strength (lbs) 68 63  (Blank rows = not tested)   SHOULDER SPECIAL TESTS: deferred   JOINT MOBILITY TESTING:  deferred   PALPATION:  unremarkable             TODAY'S TREATMENT:     [facility] Adult PT Treatment:                                                DATE: 11/12/22 Therapeutic Exercise: Seated scapular retraction 2x10 Seated shoulder shrugs 2x10 Seated shoulder flexion towel slide 2x10 Seated shoulder scaption towel slide 2x10 Elbow flexion/extension 500g ball 3x10 Wrist flexion/extension AROM 3x10 Manual Therapy: PROM ER/IR, abd/add, flex/ext - within pts pain tolerance  DATE: Eval and HEP     PATIENT EDUCATION: Education details: Discussed eval findings, rehab rationale and POC and patient is in agreement  Person educated: Patient Education method: Explanation Education comprehension: verbalized understanding   HOME EXERCISE PROGRAM: Access Code: [email or web address] URL: [email or web address] Date: 11/10/2022 Prepared by: Sharlynn Oliphant   Exercises - Seated Shoulder Flexion Towel Slide at Table Top Full Range of Motion  - 3 x daily - 5 x weekly - 1 sets - 10 reps - 3s hold - Seated Shoulder Scaption Slide at Table Top with Forearm in Neutral  - 3 x daily - 5 x weekly - 1 sets - 10 reps - 3s hold - Seated Shoulder Shrugs  - 3 x daily - 5 x weekly - 1 sets - 10 reps - Seated Scapular Retraction  - 3 x daily - 5 x weekly - 1 sets - 10 reps   ASSESSMENT:   CLINICAL IMPRESSION: Patient presents to first follow up PT session reporting increased soreness after evaluation, she hasn't had a chance to complete her HEP yet. Session today focused on periscapular, elbow, and wrist  strengthening as well as PROM for shoulder. Patient was able to tolerate all prescribed exercises with no adverse effects. Patient continues to benefit from skilled PT services and should be progressed as able to improve functional independence.     OBJECTIVE IMPAIRMENTS: decreased activity tolerance, decreased knowledge of condition, decreased mobility, decreased ROM, decreased strength, increased fascial restrictions, impaired perceived functional ability, impaired UE functional use, postural dysfunction, and pain.    ACTIVITY LIMITATIONS: carrying, lifting, reach over head, and caring for others   PARTICIPATION LIMITATIONS: meal prep, cleaning, laundry, driving, shopping, and community activity   PERSONAL FACTORS: Age, Fitness, Past/current experiences, and Time since onset of injury/illness/exacerbation are also affecting patient's functional outcome.    REHAB POTENTIAL: Good   CLINICAL DECISION MAKING: Stable/uncomplicated   EVALUATION COMPLEXITY: Low     GOALS: Goals reviewed with patient? Yes   SHORT TERM GOALS: Target date: 12/01/2022   Patient to demonstrate independence in HEP  Baseline: [id number] Goal status: INITIAL   2.  120d AROM flexion and abduction R shoulder Baseline:  A/PROM Right eval Left eval  Shoulder flexion 100/117d 160  Shoulder extension 30 45  Shoulder abduction 90/80d 160    Goal status: INITIAL       LONG TERM GOALS: Target date: 12/22/2022     Increase AROM R shoulder to 160d Baseline:  A/PROM Right eval Left eval  Shoulder flexion 100/117d 160  Shoulder extension 30 45  Shoulder abduction 90/80d 160    Goal status: INITIAL   2.  Increase R shoulder strength to 4/5 when appropriate for strengthening Baseline:  MMT Right eval Left eval  Shoulder flexion 3 5  Shoulder extension 3 5  Shoulder abduction 3 5    Goal status: INITIAL   3.  Resolve R scapular dyskinesis with AROM Baseline: R scapular winging noted with AROM Goal  status: INITIAL   4.  Decrease pain to 4/10 at worst Baseline: 7/10 at worst Goal status: INITIAL   5.  Increase FOTO score to 64 Baseline: 54 Goal status: INITIAL       PLAN:   PT FREQUENCY: 2x/week   PT DURATION: 6 weeks   PLANNED INTERVENTIONS: Therapeutic exercises, Therapeutic activity, Neuromuscular re-education, Balance training, Gait training, Patient/Family education, Self Care, Joint mobilization, Dry Needling, Manual therapy, and Re-evaluation   PLAN FOR NEXT SESSION: HEP review  and update, PROM, scapular mobilization  and strengthening of periscapular muscles.   Margarette Canada, PTA 11/12/2022, 1:06 PM

## 2022-11-19 ENCOUNTER — Ambulatory Visit: Payer: Medicare PPO

## 2022-11-19 DIAGNOSIS — R293 Abnormal posture: Secondary | ICD-10-CM

## 2022-11-19 DIAGNOSIS — M25611 Stiffness of right shoulder, not elsewhere classified: Secondary | ICD-10-CM

## 2022-11-19 DIAGNOSIS — M6281 Muscle weakness (generalized): Secondary | ICD-10-CM

## 2022-11-19 NOTE — Therapy (Signed)
OUTPATIENT PHYSICAL THERAPY TREATMENT NOTE   Patient Name: Veronica Cortez MRN: [id number] DOB:Dec 23, 1970, 52 y.o., female Today's Date: 11/19/2022  PCP: Ellyn Hack, MD  REFERRING PROVIDER: [name], MD   END OF SESSION:   PT End of Session - 11/19/22 0912     Visit Number 3    Number of Visits 12    Date for PT Re-Evaluation 01/05/23    Authorization Type [employer] MCR    PT Start Time 0915    PT Stop Time 0953    PT Time Calculation (min) 38 min    Activity Tolerance Patient tolerated treatment well    Behavior During Therapy WFL for tasks assessed/performed              Past Medical History:  Diagnosis Date   Asthma    Depression    Diabetes mellitus without complication    Fibromyalgia    GERD (gastroesophageal reflux disease)    Hypertension    IBD (inflammatory bowel disease)    Migraine    Neuropathy    Sleep apnea    Past Surgical History:  Procedure Laterality Date   ABDOMINAL HYSTERECTOMY     BACK SURGERY  2013   ENDOSCOPIC PLANTAR FASCIOTOMY     HAND SURGERY     HIP PINNING,CANNULATED Right 10/18/2021   Procedure: CANNULATED HIP PINNING;  Surgeon: [name], MD;  Location: [facility];  Service: Orthopedics;  Laterality: Right;   TUBAL LIGATION     Patient Active Problem List   Diagnosis Date Noted   Femoral fracture 10/18/2021   Closed subcapital fracture of right femur    Fall    Hyperglycemia due to diabetes mellitus    Acute hemorrhagic colitis 04/16/2021   Insulin-requiring or dependent type II diabetes mellitus    Asthma    Essential hypertension    Depression     REFERRING DIAG: S42.294A (ICD-10-CM) - Other closed nondisplaced fracture of proximal end of right humerus, initial encounter   THERAPY DIAG:  Muscle weakness (generalized)  Abnormal posture  Decreased ROM of right shoulder  Rationale for Evaluation and Treatment Rehabilitation  PERTINENT HISTORY: 52 year old female with a right nondisplaced  humeral neck fracture after a fall from standing. She has interval callus formation. Given the fact that she is healing well I would like her to work on active and passive range of motion at this point. Will plan for physical therapy for range of motion. She will not do any heavy lifting until her next appointment in 6 weeks   ONSET DATE: 10/02/22   PRECAUTIONS: Shoulder   SUBJECTIVE:  SUBJECTIVE STATEMENT: Patient reports increased soreness in her shoulder today, states that she went to an arcade yesterday. She also states she was putting plates away today and felt a pop, advised patient to adhere to NWB precautions.   PAIN:  Are you having pain? Yes: NPRS scale: 3-6/10 Pain location: R shoulder Pain description: ache Aggravating factors: movement and use Relieving factors: rest and tylenol   OBJECTIVE: (objective measures completed at initial evaluation unless otherwise dated)   DIAGNOSTIC FINDINGS:  X-ray right shoulder 3 views: Nondisplaced humeral neck fracture with interval callus formation   PATIENT SURVEYS:  FOTO 54(64 predicted)   POSTURE: Rounded and forward shoulders, forward head   UPPER EXTREMITY ROM:    A/PROM Right eval Left eval  Shoulder flexion 100/117d 160  Shoulder extension 30 45  Shoulder abduction 90/80d 160  Shoulder adduction      Shoulder internal rotation 70d PROM    Shoulder external rotation 40d PROM    Elbow flexion      Elbow extension      Wrist flexion      Wrist extension      Wrist ulnar deviation      Wrist radial deviation      Wrist pronation      Wrist supination      (Blank rows = not tested)   UPPER EXTREMITY MMT:   MMT Right eval Left eval  Shoulder flexion 3 5  Shoulder extension 3 5  Shoulder abduction 3 5  Shoulder adduction       Shoulder internal rotation      Shoulder external rotation      Middle trapezius 4    Lower trapezius 4    Elbow flexion 4    Elbow extension 4    Wrist flexion 4    Wrist extension 4    Wrist ulnar deviation      Wrist radial deviation      Wrist pronation      Wrist supination      Grip strength (lbs) 68 63  (Blank rows = not tested)   SHOULDER SPECIAL TESTS: deferred   JOINT MOBILITY TESTING:  deferred   PALPATION:  unremarkable             TODAY'S TREATMENT:     [facility] Adult PT Treatment:                                                DATE: 11/19/22 Therapeutic Exercise: Seated scapular retraction 2x10 Seated shoulder shrugs 3x10 - cues to control descent  Seated shoulder flexion towel slide 2x10 Seated shoulder scaption towel slide 2x10 Elbow flexion/extension 500g ball 3x10 Wrist flexion/extension AROM 3x10 Manual Therapy: PROM ER/IR, abd/add, flex/ext - within pts pain tolerance   [facility] Adult PT Treatment:                                                DATE: 11/12/22 Therapeutic Exercise: Seated scapular retraction 2x10 Seated shoulder shrugs 2x10 Seated shoulder flexion towel slide 2x10 Seated shoulder scaption towel slide 2x10 Elbow flexion/extension 500g ball 3x10 Wrist flexion/extension AROM 3x10 Manual Therapy: PROM ER/IR, abd/add, flex/ext - within pts pain tolerance  DATE: Eval and HEP     PATIENT EDUCATION: Education details: Discussed eval findings, rehab rationale and POC and patient is in agreement  Person educated: Patient Education method: Explanation Education comprehension: verbalized understanding   HOME EXERCISE PROGRAM: Access Code: [email or web address] URL: [email or web address] Date: 11/10/2022 Prepared by: Gustavus Bryant   Exercises - Seated Shoulder Flexion Towel Slide at Table Top Full Range of  Motion  - 3 x daily - 5 x weekly - 1 sets - 10 reps - 3s hold - Seated Shoulder Scaption Slide at Table Top with Forearm in Neutral  - 3 x daily - 5 x weekly - 1 sets - 10 reps - 3s hold - Seated Shoulder Shrugs  - 3 x daily - 5 x weekly - 1 sets - 10 reps - Seated Scapular Retraction  - 3 x daily - 5 x weekly - 1 sets - 10 reps   ASSESSMENT:   CLINICAL IMPRESSION: Patient presents to PT reporting increased soreness in her shoulder today, states that she felt a painful pop in her shoulder while putting plates away, and that she is also utilize a machine where she pulls a lever with both UE, advised patient to continue to adhere to NWB precautions. Session today continued to focus on periscapular, elbow, and wrist strengthening as well as PROM for shoulder. Patient continues to benefit from skilled PT services and should be progressed as able to improve functional independence.     OBJECTIVE IMPAIRMENTS: decreased activity tolerance, decreased knowledge of condition, decreased mobility, decreased ROM, decreased strength, increased fascial restrictions, impaired perceived functional ability, impaired UE functional use, postural dysfunction, and pain.    ACTIVITY LIMITATIONS: carrying, lifting, reach over head, and caring for others   PARTICIPATION LIMITATIONS: meal prep, cleaning, laundry, driving, shopping, and community activity   PERSONAL FACTORS: Age, Fitness, Past/current experiences, and Time since onset of injury/illness/exacerbation are also affecting patient's functional outcome.    REHAB POTENTIAL: Good   CLINICAL DECISION MAKING: Stable/uncomplicated   EVALUATION COMPLEXITY: Low     GOALS: Goals reviewed with patient? Yes   SHORT TERM GOALS: Target date: 12/01/2022   Patient to demonstrate independence in HEP  Baseline: [id number] Goal status: INITIAL   2.  120d AROM flexion and abduction R shoulder Baseline:  A/PROM Right eval Left eval  Shoulder flexion 100/117d 160   Shoulder extension 30 45  Shoulder abduction 90/80d 160    Goal status: INITIAL       LONG TERM GOALS: Target date: 12/22/2022     Increase AROM R shoulder to 160d Baseline:  A/PROM Right eval Left eval  Shoulder flexion 100/117d 160  Shoulder extension 30 45  Shoulder abduction 90/80d 160    Goal status: INITIAL   2.  Increase R shoulder strength to 4/5 when appropriate for strengthening Baseline:  MMT Right eval Left eval  Shoulder flexion 3 5  Shoulder extension 3 5  Shoulder abduction 3 5    Goal status: INITIAL   3.  Resolve R scapular dyskinesis with AROM Baseline: R scapular winging noted with AROM Goal status: INITIAL   4.  Decrease pain to 4/10 at worst Baseline: 7/10 at worst Goal status: INITIAL   5.  Increase FOTO score to 64 Baseline: 54 Goal status: INITIAL       PLAN:   PT FREQUENCY: 2x/week   PT DURATION: 6 weeks   PLANNED INTERVENTIONS: Therapeutic exercises, Therapeutic activity, Neuromuscular re-education, Balance training, Gait training, Patient/Family education, Self Care, Joint mobilization, Dry Needling, Manual  therapy, and Re-evaluation   PLAN FOR NEXT SESSION: HEP review  and update, PROM, scapular mobilization and strengthening of periscapular muscles.   Berta MinorStephanie Williams, PTA 11/19/2022, 9:41 AM

## 2022-11-23 NOTE — Therapy (Deleted)
OUTPATIENT PHYSICAL THERAPY TREATMENT NOTE   Patient Name: Veronica Cortez MRN: [id number] DOB:[birth year], 52 y.o., female Today's Date: 11/23/2022  PCP: Ellyn Hack, MD  REFERRING PROVIDER: [name], MD   END OF SESSION:      Past Medical History:  Diagnosis Date   Asthma    Depression    Diabetes mellitus without complication    Fibromyalgia    GERD (gastroesophageal reflux disease)    Hypertension    IBD (inflammatory bowel disease)    Migraine    Neuropathy    Sleep apnea    Past Surgical History:  Procedure Laterality Date   ABDOMINAL HYSTERECTOMY     BACK SURGERY  2013   ENDOSCOPIC PLANTAR FASCIOTOMY     HAND SURGERY     HIP PINNING,CANNULATED Right 10/18/2021   Procedure: CANNULATED HIP PINNING;  Surgeon: [name], MD;  Location: [facility] OR;  Service: Orthopedics;  Laterality: Right;   TUBAL LIGATION     Patient Active Problem List   Diagnosis Date Noted   Femoral fracture 10/18/2021   Closed subcapital fracture of right femur    Fall    Hyperglycemia due to diabetes mellitus    Acute hemorrhagic colitis 04/16/2021   Insulin-requiring or dependent type II diabetes mellitus    Asthma    Essential hypertension    Depression     REFERRING DIAG: S42.294A (ICD-10-CM) - Other closed nondisplaced fracture of proximal end of right humerus, initial encounter   THERAPY DIAG:  No diagnosis found.  Rationale for Evaluation and Treatment Rehabilitation  PERTINENT HISTORY: 52 year old female with a right nondisplaced humeral neck fracture after a fall from standing. She has interval callus formation. Given the fact that she is healing well I would like her to work on active and passive range of motion at this point. Will plan for physical therapy for range of motion. She will not do any heavy lifting until her next appointment in 6 weeks   ONSET DATE: 10/02/22   PRECAUTIONS: Shoulder   SUBJECTIVE:                                                                                                                                                                                       SUBJECTIVE STATEMENT: Patient reports increased soreness in her shoulder today, states that she went to an arcade yesterday. She also states she was putting plates away today and felt a pop, advised patient to adhere to NWB precautions.   PAIN:  Are you having pain? Yes: NPRS scale: 3-6/10 Pain location: R shoulder Pain description: ache Aggravating factors: movement and use Relieving factors: rest and tylenol  OBJECTIVE: (objective measures completed at initial evaluation unless otherwise dated)   DIAGNOSTIC FINDINGS:  X-ray right shoulder 3 views: Nondisplaced humeral neck fracture with interval callus formation   PATIENT SURVEYS:  FOTO 54(64 predicted)   POSTURE: Rounded and forward shoulders, forward head   UPPER EXTREMITY ROM:    A/PROM Right eval Left eval  Shoulder flexion 100/117d 160  Shoulder extension 30 45  Shoulder abduction 90/80d 160  Shoulder adduction      Shoulder internal rotation 70d PROM    Shoulder external rotation 40d PROM    Elbow flexion      Elbow extension      Wrist flexion      Wrist extension      Wrist ulnar deviation      Wrist radial deviation      Wrist pronation      Wrist supination      (Blank rows = not tested)   UPPER EXTREMITY MMT:   MMT Right eval Left eval  Shoulder flexion 3 5  Shoulder extension 3 5  Shoulder abduction 3 5  Shoulder adduction      Shoulder internal rotation      Shoulder external rotation      Middle trapezius 4    Lower trapezius 4    Elbow flexion 4    Elbow extension 4    Wrist flexion 4    Wrist extension 4    Wrist ulnar deviation      Wrist radial deviation      Wrist pronation      Wrist supination      Grip strength (lbs) 68 63  (Blank rows = not tested)   SHOULDER SPECIAL TESTS: deferred   JOINT MOBILITY TESTING:  deferred   PALPATION:   unremarkable             TODAY'S TREATMENT:     [facility] Adult PT Treatment:                                                DATE: 11/19/22 Therapeutic Exercise: Seated scapular retraction 2x10 Seated shoulder shrugs 3x10 - cues to control descent  Seated shoulder flexion towel slide 2x10 Seated shoulder scaption towel slide 2x10 Elbow flexion/extension 500g ball 3x10 Wrist flexion/extension AROM 3x10 Manual Therapy: PROM ER/IR, abd/add, flex/ext - within pts pain tolerance   [facility] Adult PT Treatment:                                                DATE: 11/12/22 Therapeutic Exercise: Seated scapular retraction 2x10 Seated shoulder shrugs 2x10 Seated shoulder flexion towel slide 2x10 Seated shoulder scaption towel slide 2x10 Elbow flexion/extension 500g ball 3x10 Wrist flexion/extension AROM 3x10 Manual Therapy: PROM ER/IR, abd/add, flex/ext - within pts pain tolerance  DATE: Eval and HEP     PATIENT EDUCATION: Education details: Discussed eval findings, rehab rationale and POC and patient is in agreement  Person educated: Patient Education method: Explanation Education comprehension: verbalized understanding   HOME EXERCISE PROGRAM: Access Code: [email or web address] URL: [email or web address] Date: 11/10/2022 Prepared by: Gustavus Bryant   Exercises - Seated Shoulder Flexion Towel Slide at Table Top Full Range of Motion  - 3 x daily - 5 x weekly - 1 sets - 10 reps - 3s hold - Seated Shoulder Scaption Slide at Table Top with Forearm in Neutral  - 3 x daily - 5 x weekly - 1 sets - 10 reps - 3s hold - Seated Shoulder Shrugs  - 3 x daily - 5 x weekly - 1 sets - 10 reps - Seated Scapular Retraction  - 3 x daily - 5 x weekly - 1 sets - 10 reps   ASSESSMENT:   CLINICAL IMPRESSION: Patient presents to PT reporting increased soreness in her shoulder  today, states that she felt a painful pop in her shoulder while putting plates away, and that she is also utilize a machine where she pulls a lever with both UE, advised patient to continue to adhere to NWB precautions. Session today continued to focus on periscapular, elbow, and wrist strengthening as well as PROM for shoulder. Patient continues to benefit from skilled PT services and should be progressed as able to improve functional independence.     OBJECTIVE IMPAIRMENTS: decreased activity tolerance, decreased knowledge of condition, decreased mobility, decreased ROM, decreased strength, increased fascial restrictions, impaired perceived functional ability, impaired UE functional use, postural dysfunction, and pain.    ACTIVITY LIMITATIONS: carrying, lifting, reach over head, and caring for others   PARTICIPATION LIMITATIONS: meal prep, cleaning, laundry, driving, shopping, and community activity   PERSONAL FACTORS: Age, Fitness, Past/current experiences, and Time since onset of injury/illness/exacerbation are also affecting patient's functional outcome.    REHAB POTENTIAL: Good   CLINICAL DECISION MAKING: Stable/uncomplicated   EVALUATION COMPLEXITY: Low     GOALS: Goals reviewed with patient? Yes   SHORT TERM GOALS: Target date: 12/01/2022   Patient to demonstrate independence in HEP  Baseline: [id number] Goal status: INITIAL   2.  120d AROM flexion and abduction R shoulder Baseline:  A/PROM Right eval Left eval  Shoulder flexion 100/117d 160  Shoulder extension 30 45  Shoulder abduction 90/80d 160    Goal status: INITIAL       LONG TERM GOALS: Target date: 12/22/2022     Increase AROM R shoulder to 160d Baseline:  A/PROM Right eval Left eval  Shoulder flexion 100/117d 160  Shoulder extension 30 45  Shoulder abduction 90/80d 160    Goal status: INITIAL   2.  Increase R shoulder strength to 4/5 when appropriate for strengthening Baseline:  MMT Right eval  Left eval  Shoulder flexion 3 5  Shoulder extension 3 5  Shoulder abduction 3 5    Goal status: INITIAL   3.  Resolve R scapular dyskinesis with AROM Baseline: R scapular winging noted with AROM Goal status: INITIAL   4.  Decrease pain to 4/10 at worst Baseline: 7/10 at worst Goal status: INITIAL   5.  Increase FOTO score to 64 Baseline: 54 Goal status: INITIAL       PLAN:   PT FREQUENCY: 2x/week   PT DURATION: 6 weeks   PLANNED INTERVENTIONS: Therapeutic exercises, Therapeutic activity, Neuromuscular re-education, Balance training, Gait training, Patient/Family education, Self Care, Joint mobilization, Dry Needling, Manual  therapy, and Re-evaluation   PLAN FOR NEXT SESSION: HEP review  and update, PROM, scapular mobilization and strengthening of periscapular muscles.   Hildred Laser, PT 11/23/2022, 1:00 PM

## 2022-11-24 ENCOUNTER — Ambulatory Visit: Payer: Medicare PPO

## 2022-11-26 ENCOUNTER — Ambulatory Visit: Payer: Medicare PPO

## 2022-11-26 DIAGNOSIS — M6281 Muscle weakness (generalized): Secondary | ICD-10-CM | POA: Diagnosis not present

## 2022-11-26 DIAGNOSIS — R293 Abnormal posture: Secondary | ICD-10-CM

## 2022-11-26 NOTE — Therapy (Signed)
OUTPATIENT PHYSICAL THERAPY TREATMENT NOTE   Patient Name: Veronica Cortez MRN: [id number] DOB:06/30/1971, 52 y.o., female Today's Date: 11/26/2022  PCP: Ellyn Hack, MD  REFERRING PROVIDER: [name], MD   END OF SESSION:   PT End of Session - 11/26/22 1438     Visit Number 4    Number of Visits 12    Date for PT Re-Evaluation 01/05/23    Authorization Type [employer] MCR    PT Start Time 1440    PT Stop Time 1520    PT Time Calculation (min) 40 min    Activity Tolerance Patient tolerated treatment well    Behavior During Therapy WFL for tasks assessed/performed               Past Medical History:  Diagnosis Date   Asthma    Depression    Diabetes mellitus without complication    Fibromyalgia    GERD (gastroesophageal reflux disease)    Hypertension    IBD (inflammatory bowel disease)    Migraine    Neuropathy    Sleep apnea    Past Surgical History:  Procedure Laterality Date   ABDOMINAL HYSTERECTOMY     BACK SURGERY  2013   ENDOSCOPIC PLANTAR FASCIOTOMY     HAND SURGERY     HIP PINNING,CANNULATED Right 10/18/2021   Procedure: CANNULATED HIP PINNING;  Surgeon: [name], MD;  Location: [facility] OR;  Service: Orthopedics;  Laterality: Right;   TUBAL LIGATION     Patient Active Problem List   Diagnosis Date Noted   Femoral fracture 10/18/2021   Closed subcapital fracture of right femur    Fall    Hyperglycemia due to diabetes mellitus    Acute hemorrhagic colitis 04/16/2021   Insulin-requiring or dependent type II diabetes mellitus    Asthma    Essential hypertension    Depression     REFERRING DIAG: S42.294A (ICD-10-CM) - Other closed nondisplaced fracture of proximal end of right humerus, initial encounter   THERAPY DIAG:  Muscle weakness (generalized)  Abnormal posture  Rationale for Evaluation and Treatment Rehabilitation  PERTINENT HISTORY: 52 year old female with a right nondisplaced humeral neck fracture after a fall from  standing. She has interval callus formation. Given the fact that she is healing well I would like her to work on active and passive range of motion at this point. Will plan for physical therapy for range of motion. She will not do any heavy lifting until her next appointment in 6 weeks   ONSET DATE: 10/02/22   PRECAUTIONS: Shoulder   SUBJECTIVE:  SUBJECTIVE STATEMENT: Pt presents to PT with no current reports of pain and discomfort. Has been compliant with HEP with no adverse effect.    PAIN:  Are you having pain? Yes: NPRS scale: 0/10 Pain location: R shoulder Pain description: ache Aggravating factors: movement and use Relieving factors: rest and tylenol   OBJECTIVE: (objective measures completed at initial evaluation unless otherwise dated)   DIAGNOSTIC FINDINGS:  X-ray right shoulder 3 views: Nondisplaced humeral neck fracture with interval callus formation   PATIENT SURVEYS:  FOTO 54(64 predicted)   POSTURE: Rounded and forward shoulders, forward head   UPPER EXTREMITY ROM:    A/PROM Right eval Left eval  Shoulder flexion 100/117d 160  Shoulder extension 30 45  Shoulder abduction 90/80d 160  Shoulder adduction      Shoulder internal rotation 70d PROM    Shoulder external rotation 40d PROM    Elbow flexion      Elbow extension      Wrist flexion      Wrist extension      Wrist ulnar deviation      Wrist radial deviation      Wrist pronation      Wrist supination      (Blank rows = not tested)   UPPER EXTREMITY MMT:   MMT Right eval Left eval  Shoulder flexion 3 5  Shoulder extension 3 5  Shoulder abduction 3 5  Shoulder adduction      Shoulder internal rotation      Shoulder external rotation      Middle trapezius 4    Lower trapezius 4    Elbow flexion 4    Elbow  extension 4    Wrist flexion 4    Wrist extension 4    Wrist ulnar deviation      Wrist radial deviation      Wrist pronation      Wrist supination      Grip strength (lbs) 68 63  (Blank rows = not tested)   SHOULDER SPECIAL TESTS: deferred   JOINT MOBILITY TESTING:  deferred   PALPATION:  unremarkable             TODAY'S TREATMENT:     [facility] Adult PT Treatment:                                                DATE: 11/26/22 Therapeutic Exercise: UBE lvl 1.0 (no resistance) x 3 min for AAROM while taking subjective Seated scapular retraction 2x10 Supine dow flexion 2x10 Supine dow ER AAROM 2x15 R Supine dow abd AAROM 2x10 R Seated table slide flexion x 15 R Seated table slide scaption x 15 R  Manual Therapy: PROM ER/IR, abd/add, flex/ext - within pts pain tolerance   [facility] Adult PT Treatment:                                                DATE: 11/19/22 Therapeutic Exercise: Seated scapular retraction 2x10 Seated shoulder shrugs 3x10 - cues to control descent  Seated shoulder flexion towel slide 2x10 Seated shoulder scaption towel slide 2x10 Elbow flexion/extension 500g ball 3x10 Wrist flexion/extension AROM 3x10 Manual Therapy: PROM ER/IR, abd/add, flex/ext - within pts pain tolerance   [facility]  Adult PT Treatment:                                                DATE: 11/12/22 Therapeutic Exercise: Seated scapular retraction 2x10 Seated shoulder shrugs 2x10 Seated shoulder flexion towel slide 2x10 Seated shoulder scaption towel slide 2x10 Elbow flexion/extension 500g ball 3x10 Wrist flexion/extension AROM 3x10 Manual Therapy: PROM ER/IR, abd/add, flex/ext - within pts pain tolerance                                                                                                                                        DATE: Eval and HEP     PATIENT EDUCATION: Education details: Discussed eval findings, rehab rationale and POC and patient is in agreement  Person educated:  Patient Education method: Explanation Education comprehension: verbalized understanding   HOME EXERCISE PROGRAM: Access Code: [email or web address] URL: [email or web address] Date: 11/10/2022 Prepared by: Gustavus Bryant   Exercises - Seated Shoulder Flexion Towel Slide at Table Top Full Range of Motion  - 3 x daily - 5 x weekly - 1 sets - 10 reps - 3s hold - Seated Shoulder Scaption Slide at Table Top with Forearm in Neutral  - 3 x daily - 5 x weekly - 1 sets - 10 reps - 3s hold - Seated Shoulder Shrugs  - 3 x daily - 5 x weekly - 1 sets - 10 reps - Seated Scapular Retraction  - 3 x daily - 5 x weekly - 1 sets - 10 reps   ASSESSMENT:   CLINICAL IMPRESSION: Pt was able to complete all prescribed exercises with no adverse effect. Continued to progress PROM and AAROM exercises. She continues to benefit from skilled PT, will continue per POC.     OBJECTIVE IMPAIRMENTS: decreased activity tolerance, decreased knowledge of condition, decreased mobility, decreased ROM, decreased strength, increased fascial restrictions, impaired perceived functional ability, impaired UE functional use, postural dysfunction, and pain.    ACTIVITY LIMITATIONS: carrying, lifting, reach over head, and caring for others   PARTICIPATION LIMITATIONS: meal prep, cleaning, laundry, driving, shopping, and community activity   PERSONAL FACTORS: Age, Fitness, Past/current experiences, and Time since onset of injury/illness/exacerbation are also affecting patient's functional outcome.      GOALS: Goals reviewed with patient? Yes   SHORT TERM GOALS: Target date: 12/01/2022   Patient to demonstrate independence in HEP  Baseline: [id number] Goal status: INITIAL   2.  120d AROM flexion and abduction R shoulder Baseline:  A/PROM Right eval Left eval  Shoulder flexion 100/117d 160  Shoulder extension 30 45  Shoulder abduction 90/80d 160    Goal status: INITIAL       LONG TERM GOALS: Target date: 12/22/2022      Increase AROM R shoulder to 160d Baseline:  A/PROM Right  eval Left eval  Shoulder flexion 100/117d 160  Shoulder extension 30 45  Shoulder abduction 90/80d 160    Goal status: INITIAL   2.  Increase R shoulder strength to 4/5 when appropriate for strengthening Baseline:  MMT Right eval Left eval  Shoulder flexion 3 5  Shoulder extension 3 5  Shoulder abduction 3 5    Goal status: INITIAL   3.  Resolve R scapular dyskinesis with AROM Baseline: R scapular winging noted with AROM Goal status: INITIAL   4.  Decrease pain to 4/10 at worst Baseline: 7/10 at worst Goal status: INITIAL   5.  Increase FOTO score to 64 Baseline: 54 Goal status: INITIAL       PLAN:   PT FREQUENCY: 2x/week   PT DURATION: 6 weeks   PLANNED INTERVENTIONS: Therapeutic exercises, Therapeutic activity, Neuromuscular re-education, Balance training, Gait training, Patient/Family education, Self Care, Joint mobilization, Dry Needling, Manual therapy, and Re-evaluation   PLAN FOR NEXT SESSION: HEP review  and update, PROM, scapular mobilization and strengthening of periscapular muscles.   Eloy End, PT 11/26/2022, 3:24 PM

## 2022-12-01 ENCOUNTER — Ambulatory Visit: Payer: Medicare PPO

## 2022-12-01 DIAGNOSIS — M6281 Muscle weakness (generalized): Secondary | ICD-10-CM

## 2022-12-01 DIAGNOSIS — M25611 Stiffness of right shoulder, not elsewhere classified: Secondary | ICD-10-CM

## 2022-12-01 DIAGNOSIS — R293 Abnormal posture: Secondary | ICD-10-CM

## 2022-12-01 NOTE — Therapy (Signed)
OUTPATIENT PHYSICAL THERAPY TREATMENT NOTE   Patient Name: Veronica Cortez MRN: [id number] DOB:July 03, 1971, 52 y.o., female Today's Date: 12/01/2022  PCP: Ellyn Hack, MD  REFERRING PROVIDER: [name], MD   END OF SESSION:   PT End of Session - 12/01/22 0955     Visit Number 5    Number of Visits 12    Date for PT Re-Evaluation 01/05/23    Authorization Type [employer] MCR    PT Start Time 0959    PT Stop Time 1011    PT Time Calculation (min) 12 min    Activity Tolerance Patient tolerated treatment well    Behavior During Therapy WFL for tasks assessed/performed              Past Medical History:  Diagnosis Date   Asthma    Depression    Diabetes mellitus without complication    Fibromyalgia    GERD (gastroesophageal reflux disease)    Hypertension    IBD (inflammatory bowel disease)    Migraine    Neuropathy    Sleep apnea    Past Surgical History:  Procedure Laterality Date   ABDOMINAL HYSTERECTOMY     BACK SURGERY  2013   ENDOSCOPIC PLANTAR FASCIOTOMY     HAND SURGERY     HIP PINNING,CANNULATED Right 10/18/2021   Procedure: CANNULATED HIP PINNING;  Surgeon: [name], MD;  Location: [facility] OR;  Service: Orthopedics;  Laterality: Right;   TUBAL LIGATION     Patient Active Problem List   Diagnosis Date Noted   Femoral fracture 10/18/2021   Closed subcapital fracture of right femur    Fall    Hyperglycemia due to diabetes mellitus    Acute hemorrhagic colitis 04/16/2021   Insulin-requiring or dependent type II diabetes mellitus    Asthma    Essential hypertension    Depression     REFERRING DIAG: S42.294A (ICD-10-CM) - Other closed nondisplaced fracture of proximal end of right humerus, initial encounter   THERAPY DIAG:  Muscle weakness (generalized)  Abnormal posture  Decreased ROM of right shoulder  Rationale for Evaluation and Treatment Rehabilitation  PERTINENT HISTORY: 52 year old female with a right nondisplaced  humeral neck fracture after a fall from standing. She has interval callus formation. Given the fact that she is healing well I would like her to work on active and passive range of motion at this point. Will plan for physical therapy for range of motion. She will not do any heavy lifting until her next appointment in 6 weeks   ONSET DATE: 10/02/22   PRECAUTIONS: Shoulder   SUBJECTIVE:  SUBJECTIVE STATEMENT: Patient has migraine today, but reports continued improvement in arm pain.    PAIN:  Are you having pain? Yes: NPRS scale: 2-3/10 Pain location: R shoulder Pain description: ache Aggravating factors: movement and use Relieving factors: rest and tylenol   OBJECTIVE: (objective measures completed at initial evaluation unless otherwise dated)   DIAGNOSTIC FINDINGS:  X-ray right shoulder 3 views: Nondisplaced humeral neck fracture with interval callus formation   PATIENT SURVEYS:  FOTO 54(64 predicted)   POSTURE: Rounded and forward shoulders, forward head   UPPER EXTREMITY ROM:    A/PROM Right eval Left eval  Shoulder flexion 100/117d 160  Shoulder extension 30 45  Shoulder abduction 90/80d 160  Shoulder adduction      Shoulder internal rotation 70d PROM    Shoulder external rotation 40d PROM    Elbow flexion      Elbow extension      Wrist flexion      Wrist extension      Wrist ulnar deviation      Wrist radial deviation      Wrist pronation      Wrist supination      (Blank rows = not tested)   UPPER EXTREMITY MMT:   MMT Right eval Left eval  Shoulder flexion 3 5  Shoulder extension 3 5  Shoulder abduction 3 5  Shoulder adduction      Shoulder internal rotation      Shoulder external rotation      Middle trapezius 4    Lower trapezius 4    Elbow flexion 4    Elbow  extension 4    Wrist flexion 4    Wrist extension 4    Wrist ulnar deviation      Wrist radial deviation      Wrist pronation      Wrist supination      Grip strength (lbs) 68 63  (Blank rows = not tested)   SHOULDER SPECIAL TESTS: deferred   JOINT MOBILITY TESTING:  deferred   PALPATION:  unremarkable             TODAY'S TREATMENT:     [facility] Adult PT Treatment:                                                DATE: 12/01/22 Therapeutic Exercise: UBE lvl 1.0 (no resistance) x 4 min for AAROM while taking subjective Seated scapular retraction 2x10 Supine/seated dow ER AAROM 2x15 R   [facility] Adult PT Treatment:                                                DATE: 11/26/22 Therapeutic Exercise: UBE lvl 1.0 (no resistance) x 3 min for AAROM while taking subjective Seated scapular retraction 2x10 Supine dow flexion 2x10 Supine dow ER AAROM 2x15 R Supine dow abd AAROM 2x10 R Seated table slide flexion x 15 R Seated table slide scaption x 15 R  Manual Therapy: PROM ER/IR, abd/add, flex/ext - within pts pain tolerance   [facility] Adult PT Treatment:  DATE: 11/19/22 Therapeutic Exercise: Seated scapular retraction 2x10 Seated shoulder shrugs 3x10 - cues to control descent  Seated shoulder flexion towel slide 2x10 Seated shoulder scaption towel slide 2x10 Elbow flexion/extension 500g ball 3x10 Wrist flexion/extension AROM 3x10 Manual Therapy: PROM ER/IR, abd/add, flex/ext - within pts pain tolerance       PATIENT EDUCATION: Education details: Discussed eval findings, rehab rationale and POC and patient is in agreement  Person educated: Patient Education method: Explanation Education comprehension: verbalized understanding   HOME EXERCISE PROGRAM: Access Code: [email or web address] URL: [email or web address] Date: 11/10/2022 Prepared by: Gustavus Bryant   Exercises - Seated Shoulder Flexion Towel Slide at Table Top Full Range of  Motion  - 3 x daily - 5 x weekly - 1 sets - 10 reps - 3s hold - Seated Shoulder Scaption Slide at Table Top with Forearm in Neutral  - 3 x daily - 5 x weekly - 1 sets - 10 reps - 3s hold - Seated Shoulder Shrugs  - 3 x daily - 5 x weekly - 1 sets - 10 reps - Seated Scapular Retraction  - 3 x daily - 5 x weekly - 1 sets - 10 reps   ASSESSMENT:   CLINICAL IMPRESSION: Patient presents to PT reporting continued improvements in her arm pain and motion, but does have a migraine today that she rates at a 7/10, stating she did take her migraine medication before coming to PT today. During seated AAROM exercises patient stated that she did not feel like she could continue with PT for today due to her migraine worsening. Ended session early at patients request. Patient continues to benefit from skilled PT services and should be progressed as able to improve functional independence.     OBJECTIVE IMPAIRMENTS: decreased activity tolerance, decreased knowledge of condition, decreased mobility, decreased ROM, decreased strength, increased fascial restrictions, impaired perceived functional ability, impaired UE functional use, postural dysfunction, and pain.    ACTIVITY LIMITATIONS: carrying, lifting, reach over head, and caring for others   PARTICIPATION LIMITATIONS: meal prep, cleaning, laundry, driving, shopping, and community activity   PERSONAL FACTORS: Age, Fitness, Past/current experiences, and Time since onset of injury/illness/exacerbation are also affecting patient's functional outcome.      GOALS: Goals reviewed with patient? Yes   SHORT TERM GOALS: Target date: 12/01/2022   Patient to demonstrate independence in HEP  Baseline: [id number] Goal status: MET Pt reports 2x daily adherence 12/01/22   2.  120d AROM flexion and abduction R shoulder Baseline:  A/PROM Right eval Left eval  Shoulder flexion 100/117d 160  Shoulder extension 30 45  Shoulder abduction 90/80d 160    Goal status:  INITIAL       LONG TERM GOALS: Target date: 12/22/2022     Increase AROM R shoulder to 160d Baseline:  A/PROM Right eval Left eval  Shoulder flexion 100/117d 160  Shoulder extension 30 45  Shoulder abduction 90/80d 160    Goal status: INITIAL   2.  Increase R shoulder strength to 4/5 when appropriate for strengthening Baseline:  MMT Right eval Left eval  Shoulder flexion 3 5  Shoulder extension 3 5  Shoulder abduction 3 5    Goal status: INITIAL   3.  Resolve R scapular dyskinesis with AROM Baseline: R scapular winging noted with AROM Goal status: INITIAL   4.  Decrease pain to 4/10 at worst Baseline: 7/10 at worst Goal status: INITIAL   5.  Increase FOTO score to 64 Baseline: 54 Goal status: INITIAL  PLAN:   PT FREQUENCY: 2x/week   PT DURATION: 6 weeks   PLANNED INTERVENTIONS: Therapeutic exercises, Therapeutic activity, Neuromuscular re-education, Balance training, Gait training, Patient/Family education, Self Care, Joint mobilization, Dry Needling, Manual therapy, and Re-evaluation   PLAN FOR NEXT SESSION: HEP review  and update, PROM, scapular mobilization and strengthening of periscapular muscles.   Berta Minor, PTA 12/01/2022, 10:18 AM

## 2022-12-03 ENCOUNTER — Other Ambulatory Visit: Payer: Self-pay | Admitting: Sports Medicine

## 2022-12-03 ENCOUNTER — Ambulatory Visit: Payer: Medicare PPO

## 2022-12-03 DIAGNOSIS — R293 Abnormal posture: Secondary | ICD-10-CM

## 2022-12-03 DIAGNOSIS — M6281 Muscle weakness (generalized): Secondary | ICD-10-CM | POA: Diagnosis not present

## 2022-12-03 DIAGNOSIS — E559 Vitamin D deficiency, unspecified: Secondary | ICD-10-CM

## 2022-12-03 NOTE — Therapy (Signed)
OUTPATIENT PHYSICAL THERAPY TREATMENT NOTE   Patient Name: Veronica Cortez MRN: [id number] DOB:[birth year], 52 y.o., female Today's Date: 12/03/2022  PCP: Ellyn Hack, MD  REFERRING PROVIDER: [name], MD   END OF SESSION:   PT End of Session - 12/03/22 1040     Visit Number 6    Number of Visits 12    Date for PT Re-Evaluation 01/05/23    Authorization Type [employer] MCR    PT Start Time 1042    PT Stop Time 1121    PT Time Calculation (min) 39 min    Activity Tolerance Patient tolerated treatment well    Behavior During Therapy WFL for tasks assessed/performed               Past Medical History:  Diagnosis Date   Asthma    Depression    Diabetes mellitus without complication    Fibromyalgia    GERD (gastroesophageal reflux disease)    Hypertension    IBD (inflammatory bowel disease)    Migraine    Neuropathy    Sleep apnea    Past Surgical History:  Procedure Laterality Date   ABDOMINAL HYSTERECTOMY     BACK SURGERY  2013   ENDOSCOPIC PLANTAR FASCIOTOMY     HAND SURGERY     HIP PINNING,CANNULATED Right 10/18/2021   Procedure: CANNULATED HIP PINNING;  Surgeon: [name], MD;  Location: [facility] OR;  Service: Orthopedics;  Laterality: Right;   TUBAL LIGATION     Patient Active Problem List   Diagnosis Date Noted   Femoral fracture 10/18/2021   Closed subcapital fracture of right femur    Fall    Hyperglycemia due to diabetes mellitus    Acute hemorrhagic colitis 04/16/2021   Insulin-requiring or dependent type II diabetes mellitus    Asthma    Essential hypertension    Depression     REFERRING DIAG: S42.294A (ICD-10-CM) - Other closed nondisplaced fracture of proximal end of right humerus, initial encounter   THERAPY DIAG:  Muscle weakness (generalized)  Abnormal posture  Rationale for Evaluation and Treatment Rehabilitation  PERTINENT HISTORY: 52 year old female with a right nondisplaced humeral neck fracture after a fall from  standing. She has interval callus formation. Given the fact that she is healing well I would like her to work on active and passive range of motion at this point. Will plan for physical therapy for range of motion. She will not do any heavy lifting until her next appointment in 6 weeks   ONSET DATE: 10/02/22   PRECAUTIONS: Shoulder   SUBJECTIVE:  SUBJECTIVE STATEMENT: Pt presents to PT with reports of slight increase in pain after reaching for her purse in the backseat. Has been compliant with HEP.    PAIN:  Are you having pain? Yes: NPRS scale: 5/10 Pain location: R shoulder Pain description: ache Aggravating factors: movement and use Relieving factors: rest and tylenol   OBJECTIVE: (objective measures completed at initial evaluation unless otherwise dated)   DIAGNOSTIC FINDINGS:  X-ray right shoulder 3 views: Nondisplaced humeral neck fracture with interval callus formation   PATIENT SURVEYS:  FOTO 54(64 predicted)   POSTURE: Rounded and forward shoulders, forward head   UPPER EXTREMITY ROM:    A/PROM Right eval Right (PROM) 12/03/22  Shoulder flexion 100/117d 130  Shoulder extension 30   Shoulder abduction 90/80d   Shoulder adduction      Shoulder internal rotation 70d PROM    Shoulder external rotation 40d PROM    Elbow flexion      Elbow extension      Wrist flexion      Wrist extension      Wrist ulnar deviation      Wrist radial deviation      Wrist pronation      Wrist supination      (Blank rows = not tested)   UPPER EXTREMITY MMT:   MMT Right eval Left eval  Shoulder flexion 3 5  Shoulder extension 3 5  Shoulder abduction 3 5  Shoulder adduction      Shoulder internal rotation      Shoulder external rotation      Middle trapezius 4    Lower trapezius 4    Elbow  flexion 4    Elbow extension 4    Wrist flexion 4    Wrist extension 4    Wrist ulnar deviation      Wrist radial deviation      Wrist pronation      Wrist supination      Grip strength (lbs) 68 63  (Blank rows = not tested)   SHOULDER SPECIAL TESTS: deferred   JOINT MOBILITY TESTING:  deferred   PALPATION:  unremarkable             TODAY'S TREATMENT:     [facility] Adult PT Treatment:                                                DATE: 12/03/22 Therapeutic Exercise: Seated scapular retraction 2x10 Supine dow flexion 2x10 AAROM Supine dow ER AAROM 2x15 R Pulley's shoulder flexion/scap x 2 min each Manual Therapy: PROM ER/IR, abd/add, flex/ext - within pts pain tolerance   [facility] Adult PT Treatment:                                                DATE: 12/01/22 Therapeutic Exercise: UBE lvl 1.0 (no resistance) x 4 min for AAROM while taking subjective Seated scapular retraction 2x10 Supine/seated dow ER AAROM 2x15 R  [facility] Adult PT Treatment:  DATE: 11/26/22 Therapeutic Exercise: UBE lvl 1.0 (no resistance) x 3 min for AAROM while taking subjective Seated scapular retraction 2x10 Supine dow flexion 2x10 Supine dow ER AAROM 2x15 R Supine dow abd AAROM 2x10 R Seated table slide flexion x 15 R Seated table slide scaption x 15 R  Manual Therapy: PROM ER/IR, abd/add, flex/ext - within pts pain tolerance   PATIENT EDUCATION: Education details: Discussed eval findings, rehab rationale and POC and patient is in agreement  Person educated: Patient Education method: Explanation Education comprehension: verbalized understanding   HOME EXERCISE PROGRAM: Access Code: [email or web address] URL: [email or web address] Date: 11/10/2022 Prepared by: Gustavus Bryant   Exercises - Seated Shoulder Flexion Towel Slide at Table Top Full Range of Motion  - 3 x daily - 5 x weekly - 1 sets - 10 reps - 3s hold - Seated Shoulder Scaption Slide at  Table Top with Forearm in Neutral  - 3 x daily - 5 x weekly - 1 sets - 10 reps - 3s hold - Seated Shoulder Shrugs  - 3 x daily - 5 x weekly - 1 sets - 10 reps - Seated Scapular Retraction  - 3 x daily - 5 x weekly - 1 sets - 10 reps   ASSESSMENT:   CLINICAL IMPRESSION: Pt was able to complete all prescribed exercises with no adverse effect. Continued to progress PROM and AAROM exercises. She continues to benefit from skilled PT, will continue per POC.     OBJECTIVE IMPAIRMENTS: decreased activity tolerance, decreased knowledge of condition, decreased mobility, decreased ROM, decreased strength, increased fascial restrictions, impaired perceived functional ability, impaired UE functional use, postural dysfunction, and pain.    ACTIVITY LIMITATIONS: carrying, lifting, reach over head, and caring for others   PARTICIPATION LIMITATIONS: meal prep, cleaning, laundry, driving, shopping, and community activity   PERSONAL FACTORS: Age, Fitness, Past/current experiences, and Time since onset of injury/illness/exacerbation are also affecting patient's functional outcome.      GOALS: Goals reviewed with patient? Yes   SHORT TERM GOALS: Target date: 12/01/2022   Patient to demonstrate independence in HEP  Baseline: [id number] Goal status: MET Pt reports 2x daily adherence 12/01/22   2.  120d AROM flexion and abduction R shoulder Baseline:  A/PROM Right eval Left eval  Shoulder flexion 100/117d 160  Shoulder extension 30 45  Shoulder abduction 90/80d 160    Goal status: INITIAL       LONG TERM GOALS: Target date: 12/22/2022     Increase AROM R shoulder to 160d Baseline:  A/PROM Right eval Left eval  Shoulder flexion 100/117d 160  Shoulder extension 30 45  Shoulder abduction 90/80d 160    Goal status: INITIAL   2.  Increase R shoulder strength to 4/5 when appropriate for strengthening Baseline:  MMT Right eval Left eval  Shoulder flexion 3 5  Shoulder extension 3 5   Shoulder abduction 3 5    Goal status: INITIAL   3.  Resolve R scapular dyskinesis with AROM Baseline: R scapular winging noted with AROM Goal status: INITIAL   4.  Decrease pain to 4/10 at worst Baseline: 7/10 at worst Goal status: INITIAL   5.  Increase FOTO score to 64 Baseline: 54 Goal status: INITIAL       PLAN:   PT FREQUENCY: 2x/week   PT DURATION: 6 weeks   PLANNED INTERVENTIONS: Therapeutic exercises, Therapeutic activity, Neuromuscular re-education, Balance training, Gait training, Patient/Family education, Self Care, Joint mobilization, Dry Needling, Manual therapy, and Re-evaluation   PLAN FOR NEXT  SESSION: HEP review  and update, PROM, scapular mobilization and strengthening of periscapular muscles.   Eloy End, PT 12/03/2022, 11:28 AM

## 2022-12-08 ENCOUNTER — Ambulatory Visit: Payer: Medicare PPO

## 2022-12-08 DIAGNOSIS — M6281 Muscle weakness (generalized): Secondary | ICD-10-CM

## 2022-12-08 DIAGNOSIS — R293 Abnormal posture: Secondary | ICD-10-CM

## 2022-12-08 NOTE — Therapy (Signed)
OUTPATIENT PHYSICAL THERAPY TREATMENT NOTE   Patient Name: Veronica Cortez MRN: [id number] DOB:[birth year], 52 y.o., female Today's Date: 12/08/2022  PCP: Ellyn Hack, MD  REFERRING PROVIDER: [name], MD   END OF SESSION:   PT End of Session - 12/08/22 1055     Visit Number 7    Number of Visits 12    Date for PT Re-Evaluation 01/05/23    Authorization Type [employer] M[employer]    PT Start Time 1055   arrived late   PT Stop Time 1125    PT Time Calculation (min) 30 min    Activity Tolerance Patient tolerated treatment well    Behavior During Therapy WFL for tasks assessed/performed                Past Medical History:  Diagnosis Date   Asthma    Depression    Diabetes mellitus without complication (HCC)    Fibromyalgia    GERD (gastroesophageal reflux disease)    Hypertension    IBD (inflammatory bowel disease)    Migraine    Neuropathy    Sleep apnea    Past Surgical History:  Procedure Laterality Date   ABDOMINAL HYSTERECTOMY     BACK SURGERY  2013   ENDOSCOPIC PLANTAR FASCIOTOMY     HAND SURGERY     HIP PINNING,CANNULATED Right 10/18/2021   Procedure: CANNULATED HIP PINNING;  Surgeon: [name], MD;  Location: [facility] OR;  Service: Orthopedics;  Laterality: Right;   TUBAL LIGATION     Patient Active Problem List   Diagnosis Date Noted   Femoral fracture (HCC) 10/18/2021   Closed subcapital fracture of right femur (HCC)    Fall    Hyperglycemia due to diabetes mellitus (HCC)    Acute hemorrhagic colitis 04/16/2021   Insulin-requiring or dependent type II diabetes mellitus (HCC)    Asthma    Essential hypertension    Depression     REFERRING DIAG: S42.294A (ICD-10-CM) - Other closed nondisplaced fracture of proximal end of right humerus, initial encounter   THERAPY DIAG:  Muscle weakness (generalized)  Abnormal posture  Rationale for Evaluation and Treatment Rehabilitation  PERTINENT HISTORY: 52 year old female with a right  nondisplaced humeral neck fracture after a fall from standing. She has interval callus formation. Given the fact that she is healing well I would like her to work on active and passive range of motion at this point. Will plan for physical therapy for range of motion. She will not do any heavy lifting until her next appointment in 6 weeks   ONSET DATE: 10/02/22   PRECAUTIONS: Shoulder   SUBJECTIVE:  SUBJECTIVE STATEMENT: Pt presents to PT with reports of decreased pain noted. Has been compliant with HEP with no adverse effect.    PAIN:  Are you having pain? Yes: NPRS scale: 5/10 Pain location: R shoulder Pain description: ache Aggravating factors: movement and use Relieving factors: rest and tylenol   OBJECTIVE: (objective measures completed at initial evaluation unless otherwise dated)   PATIENT SURVEYS:  FOTO 54(64 predicted)   POSTURE: Rounded and forward shoulders, forward head   UPPER EXTREMITY ROM:    A/PROM Right eval Right (PROM) 12/03/22  Shoulder flexion 100/117d 130  Shoulder extension 30   Shoulder abduction 90/80d   Shoulder adduction      Shoulder internal rotation 70d PROM    Shoulder external rotation 40d PROM    Elbow flexion      Elbow extension      Wrist flexion      Wrist extension      Wrist ulnar deviation      Wrist radial deviation      Wrist pronation      Wrist supination      (Blank rows = not tested)   UPPER EXTREMITY MMT:   MMT Right eval Left eval  Shoulder flexion 3 5  Shoulder extension 3 5  Shoulder abduction 3 5  Shoulder adduction      Shoulder internal rotation      Shoulder external rotation      Middle trapezius 4    Lower trapezius 4    Elbow flexion 4    Elbow extension 4    Wrist flexion 4    Wrist extension 4    Wrist ulnar deviation       Wrist radial deviation      Wrist pronation      Wrist supination      Grip strength (lbs) 68 63  (Blank rows = not tested)             TODAY'S TREATMENT:     [facility] Adult PT Treatment:                                                DATE: 12/08/22 Therapeutic Exercise: Pulleys shoulder flexion x 2 min  UBE lvl 1.0 x 2 min fwd for ROM only Seated scapular retraction 2x10 Supine dow flexion 3x10 AAROM Supine dow ER AAROM 2x15 R Manual Therapy: PROM ER/IR to R shoulder  [facility] Adult PT Treatment:                                                DATE: 12/03/22 Therapeutic Exercise: Seated scapular retraction 2x10 Supine dow flexion 2x10 AAROM Supine dow ER AAROM 2x15 R Pulley's shoulder flexion/scap x 2 min each Manual Therapy: PROM ER/IR, abd/add, flex/ext - within pts pain tolerance   [facility] Adult PT Treatment:                                                DATE: 12/01/22 Therapeutic Exercise: UBE lvl 1.0 (no resistance) x 4 min for AAROM while taking subjective Seated scapular retraction 2x10  Supine/seated dow ER [facility] Adult PT Treatment:                                                DATE: 11/26/22 Therapeutic Exercise: UBE lvl 1.0 (no resistance) x 3 min for AAROM while taking subjective Seated scapular retraction 2x10 Supine dow flexion 2x10 Supine dow ER AAROM 2x15 R Supine dow abd AAROM 2x10 R Seated table slide flexion x 15 R Seated table slide scaption x 15 R  Manual Therapy: PROM ER/IR, abd/add, flex/ext - within pts pain tolerance   PATIENT EDUCATION: Education details: Discussed eval findings, rehab rationale and POC and patient is in agreement  Person educated: Patient Education method: Explanation Education comprehension: verbalized understanding   HOME EXERCISE PROGRAM: Access Code: [email or web address] URL: [email or web address] Date: 11/10/2022 Prepared by: Gustavus Bryant   Exercises - Seated Shoulder Flexion Towel Slide at Table Top Full  Range of Motion  - 3 x daily - 5 x weekly - 1 sets - 10 reps - 3s hold - Seated Shoulder Scaption Slide at Table Top with Forearm in Neutral  - 3 x daily - 5 x weekly - 1 sets - 10 reps - 3s hold - Seated Shoulder Shrugs  - 3 x daily - 5 x weekly - 1 sets - 10 reps - Seated Scapular Retraction  - 3 x daily - 5 x weekly - 1 sets - 10 reps   ASSESSMENT:   CLINICAL IMPRESSION: Pt was able to complete all prescribed exercises with no adverse effect. Continued to progress PROM and AAROM exercises. No change in FOTO score today, pt sees orthopedics tomorrow with hopefully progression of restrictions. She continues to progress well with therapy, will continue per POC.     OBJECTIVE IMPAIRMENTS: decreased activity tolerance, decreased knowledge of condition, decreased mobility, decreased ROM, decreased strength, increased fascial restrictions, impaired perceived functional ability, impaired UE functional use, postural dysfunction, and pain.    ACTIVITY LIMITATIONS: carrying, lifting, reach over head, and caring for others   PARTICIPATION LIMITATIONS: meal prep, cleaning, laundry, driving, shopping, and community activity   PERSONAL FACTORS: Age, Fitness, Past/current experiences, and Time since onset of injury/illness/exacerbation are also affecting patient's functional outcome.      GOALS: Goals reviewed with patient? Yes   SHORT TERM GOALS: Target date: 12/01/2022   Patient to demonstrate independence in HEP  Baseline: [id number] Goal status: MET Pt reports 2x daily adherence 12/01/22   2.  120d AROM flexion and abduction R shoulder Baseline:  A/PROM Right eval Left eval  Shoulder flexion 100/117d 160  Shoulder extension 30 45  Shoulder abduction 90/80d 160    Goal status: INITIAL       LONG TERM GOALS: Target date: 12/22/2022     Increase AROM R shoulder to 160d Baseline:  A/PROM Right eval Left eval  Shoulder flexion 100/117d 160  Shoulder extension 30 45  Shoulder  abduction 90/80d 160    Goal status: INITIAL   2.  Increase R shoulder strength to 4/5 when appropriate for strengthening Baseline:  MMT Right eval Left eval  Shoulder flexion 3 5  Shoulder extension 3 5  Shoulder abduction 3 5    Goal status: INITIAL   3.  Resolve R scapular dyskinesis with AROM Baseline: R scapular winging noted with AROM Goal status: INITIAL   4.  Decrease pain to  4/10 at worst Baseline: 7/10 at worst Goal status: INITIAL   5.  Increase FOTO score to 64 Baseline: 54 Goal status: INITIAL       PLAN:   PT FREQUENCY: 2x/week   PT DURATION: 6 weeks   PLANNED INTERVENTIONS: Therapeutic exercises, Therapeutic activity, Neuromuscular re-education, Balance training, Gait training, Patient/Family education, Self Care, Joint mobilization, Dry Needling, Manual therapy, and Re-evaluation   PLAN FOR NEXT SESSION: HEP review  and update, PROM, scapular mobilization and strengthening of periscapular muscles.   Eloy End, PT 12/08/2022, 11:26 AM

## 2022-12-09 ENCOUNTER — Ambulatory Visit (INDEPENDENT_AMBULATORY_CARE_PROVIDER_SITE_OTHER): Payer: Medicare PPO | Admitting: Orthopaedic Surgery

## 2022-12-09 ENCOUNTER — Encounter (HOSPITAL_BASED_OUTPATIENT_CLINIC_OR_DEPARTMENT_OTHER): Payer: Self-pay | Admitting: Orthopaedic Surgery

## 2022-12-09 ENCOUNTER — Ambulatory Visit (HOSPITAL_BASED_OUTPATIENT_CLINIC_OR_DEPARTMENT_OTHER): Payer: Medicare PPO

## 2022-12-09 DIAGNOSIS — S42294A Other nondisplaced fracture of upper end of right humerus, initial encounter for closed fracture: Secondary | ICD-10-CM

## 2022-12-09 NOTE — Progress Notes (Signed)
Follow-up evaluation     Interval History:   10/21/2022: Presents today for follow-up of her right shoulder.  Overall she is dramatically improving.  Range of motion is progressing with physical therapy as well as strength.  Jarelis presents today with a right minimally displaced proximal humerus fracture after a fall from standing.  She states that her cat tripped her.  This was the previous weekend.  She was seen in the emergency room placed in a sling.  She has been nonweightbearing.  She is here today for further follow-up.  She has been in fairly significant pain with limited ability to sleep.   PMH/PSH/Family History/Social History/Meds/Allergies:    Past Medical History:  Diagnosis Date   Asthma    Depression    Diabetes mellitus without complication (HCC)    Fibromyalgia    GERD (gastroesophageal reflux disease)    Hypertension    IBD (inflammatory bowel disease)    Migraine    Neuropathy    Sleep apnea    Past Surgical History:  Procedure Laterality Date   ABDOMINAL HYSTERECTOMY     BACK SURGERY  2013   ENDOSCOPIC PLANTAR FASCIOTOMY     HAND SURGERY     HIP PINNING,CANNULATED Right 10/18/2021   Procedure: CANNULATED HIP PINNING;  Surgeon: [name], MD;  Location: [facility] OR;  Service: Orthopedics;  Laterality: Right;   TUBAL LIGATION     Social History   Socioeconomic History   Marital status: Single    Spouse name: Not on file   Number of children: Not on file   Years of education: Not on file   Highest education level: Not on file  Occupational History   Not on file  Tobacco Use   Smoking status: Former    Types: Cigarettes    Quit date: 2012    Years since quitting: 12.2   Smokeless tobacco: Never  Vaping Use   Vaping Use: Never used  Substance and Sexual Activity   Alcohol use: Yes    Alcohol/week: 2.0 standard drinks of alcohol    Types: 2 Glasses of wine per week   Drug use: Never   Sexual activity: Not on  file  Other Topics Concern   Not on file  Social History Narrative   Not on file   Social Determinants of Health   Financial Resource Strain: Not on file  Food Insecurity: Not on file  Transportation Needs: Not on file  Physical Activity: Not on file  Stress: Not on file  Social Connections: Not on file   Family History  Problem Relation Age of Onset   Breast cancer Mother    Kidney failure Father    Allergies  Allergen Reactions   Acetazolamide Other (See Comments)    unknown   Benadryl [Diphenhydramine] Other (See Comments)    unknown   Clarithromycin Other (See Comments)    unknown   Compazine [Prochlorperazine] Other (See Comments)    unknown   Fentanyl Other (See Comments)    unknown   Lamictal [Lamotrigine] Other (See Comments)    unknown   Other Other (See Comments)    Triptans and statins; unknown   Reglan [Metoclopramide] Other (See Comments)    unknown   Topiramate Other (See Comments)    unknown   Current Outpatient Medications  Medication Sig Dispense  Refill   ADVAIR HFA 115-21 MCG/ACT inhaler Inhale 2 puffs into the lungs 2 (two) times daily.     AJOVY 225 MG/1.5ML SOAJ Inject 1.5 mLs into the skin every 30 (thirty) days.     albuterol (VENTOLIN HFA) 108 (90 Base) MCG/ACT inhaler Inhale 2 puffs into the lungs every 4 (four) hours as needed for wheezing or shortness of breath.     amitriptyline (ELAVIL) 100 MG tablet Take 100 mg by mouth at bedtime.     aspirin-acetaminophen-caffeine (EXCEDRIN MIGRAINE) 250-250-65 MG tablet Take 2 tablets by mouth every 6 (six) hours as needed for headache.     atenolol (TENORMIN) 25 MG tablet Take 25 mg by mouth daily.     dicyclomine (BENTYL) 10 MG capsule Take 1 capsule (10 mg total) by mouth 3 (three) times daily before meals. 90 capsule 0   enoxaparin (LOVENOX) 40 MG/0.4ML injection Inject 0.4 mLs (40 mg total) into the skin daily for 14 days. 5.6 mL 0   FLUoxetine (PROZAC) 40 MG capsule Take 40 mg by mouth daily.      HYDROcodone-acetaminophen (NORCO/VICODIN) 5-325 MG tablet Take 1 tablet by mouth every 6 (six) hours as needed for moderate pain. 30 tablet 0   HYDROcodone-acetaminophen (NORCO/VICODIN) 5-325 MG tablet Take 1 tablet by mouth every 6 (six) hours as needed for moderate pain. 30 tablet 0   Insulin Aspart FlexPen (NOVOLOG) 100 UNIT/ML Inject 10-16 Units into the skin 3 (three) times daily.     JANUVIA 100 MG tablet Take 100 mg by mouth daily.     LANTUS SOLOSTAR 100 UNIT/ML Solostar Pen Inject 30 Units into the skin at bedtime.     Melatonin 10 MG TABS Take 10 mg by mouth at bedtime.     montelukast (SINGULAIR) 10 MG tablet Take 10 mg by mouth daily.     Omega-3 Fatty Acids (FISH OIL PO) Take 1 capsule by mouth daily.     omeprazole (PRILOSEC) 20 MG capsule Take 20 mg by mouth 2 (two) times daily.     ondansetron (ZOFRAN-ODT) 8 MG disintegrating tablet Take 1 tablet (8 mg total) by mouth every 8 (eight) hours as needed for nausea or vomiting. 20 tablet 0   oxyCODONE-acetaminophen (PERCOCET/ROXICET) 5-325 MG tablet Take 1 tablet by mouth every 6 (six) hours as needed for severe pain. 15 tablet 0   Probiotic Product (PROBIOTIC PO) Take 1 capsule by mouth daily.     promethazine (PHENERGAN) 25 MG tablet Take 25 mg by mouth daily as needed for nausea/vomiting.     Riboflavin (B2 PO) Take 1 tablet by mouth daily.     rosuvastatin (CRESTOR) 10 MG tablet Take 10 mg by mouth at bedtime.     topiramate (TOPAMAX) 200 MG tablet Take 200 mg by mouth 2 (two) times daily.     UBRELVY 100 MG TABS Take 100 mg by mouth 2 (two) times daily as needed for migraine.     Vitamin D, Ergocalciferol, (DRISDOL) 1.25 MG (50000 UNIT) CAPS capsule Take 1 capsule (50,000 Units total) by mouth every 7 (seven) days for 8 doses. 8 capsule 0   VITAMIN E PO Take 1 tablet by mouth daily.     Current Facility-Administered Medications  Medication Dose Route Frequency Provider Last Rate Last Admin   acetaminophen (TYLENOL) tablet  1,000 mg  1,000 mg Oral Q8H Brimage, Vondra, DO       No results found.  Review of Systems:   A ROS was performed including pertinent positives and  negatives as documented in the HPI.   Musculoskeletal Exam:    There were no vitals taken for this visit.  Right shoulder is minimally tender to palpation.  Active range of motion is forward elevation of 160 degrees compared to 170 on contralateral side.  External rotation is to 50 degrees compared to 70 on the contralateral side.  Internal rotation is to L3.  Imaging:    X-ray right shoulder 3 views: Nondisplaced humeral neck fracture with interval callus formation  I personally reviewed and interpreted the radiographs.   Assessment:   52 year old female with a right nondisplaced humeral neck fracture after a fall from standing.  X-rays today show significant callus formation.  This time she may progress to strengthening as well as improve her terminal range of motion.  I will plan to see her back in 6 weeks for likely final check  Plan :    -Return to clinic 6 weeks     I personally saw and evaluated the patient, and participated in the management and treatment plan.  [name], MD Attending Physician, Orthopedic Surgery  This document was dictated using Dragon voice recognition software. A reasonable attempt at proof reading has been made to minimize errors.

## 2022-12-09 NOTE — Therapy (Addendum)
OUTPATIENT PHYSICAL THERAPY TREATMENT NOTE   Patient Name: Veronica Cortez MRN: [id number] DOB:1971-04-16, 52 y.o., female Today's Date: 12/10/2022  PCP: Ellyn Hack, MD  REFERRING PROVIDER: [name], MD  PHYSICAL THERAPY DISCHARGE SUMMARY  Visits from Start of Care: 8  Current functional level related to goals / functional outcomes: UTA   Remaining deficits: UTA   Education / Equipment: HEP   Patient agrees to discharge. Patient goals were partially met. Patient is being discharged due to not returning since the last visit.  END OF SESSION:   PT End of Session - 12/10/22 1046     Visit Number 8    Number of Visits 12    Date for PT Re-Evaluation 01/05/23    Authorization Type [employer] MCR    PT Start Time 1045    PT Stop Time 1123    PT Time Calculation (min) 38 min    Activity Tolerance Patient tolerated treatment well    Behavior During Therapy WFL for tasks assessed/performed                 Past Medical History:  Diagnosis Date   Asthma    Depression    Diabetes mellitus without complication (HCC)    Fibromyalgia    GERD (gastroesophageal reflux disease)    Hypertension    IBD (inflammatory bowel disease)    Migraine    Neuropathy    Sleep apnea    Past Surgical History:  Procedure Laterality Date   ABDOMINAL HYSTERECTOMY     BACK SURGERY  2013   ENDOSCOPIC PLANTAR FASCIOTOMY     HAND SURGERY     HIP PINNING,CANNULATED Right 10/18/2021   Procedure: CANNULATED HIP PINNING;  Surgeon: [name], MD;  Location: [facility] OR;  Service: Orthopedics;  Laterality: Right;   TUBAL LIGATION     Patient Active Problem List   Diagnosis Date Noted   Femoral fracture (HCC) 10/18/2021   Closed subcapital fracture of right femur (HCC)    Fall    Hyperglycemia due to diabetes mellitus (HCC)    Acute hemorrhagic colitis 04/16/2021   Insulin-requiring or dependent type II diabetes mellitus (HCC)    Asthma    Essential hypertension     Depression     REFERRING DIAG: S42.294A (ICD-10-CM) - Other closed nondisplaced fracture of proximal end of right humerus, initial encounter   THERAPY DIAG:  Muscle weakness (generalized)  Decreased ROM of right shoulder  Rationale for Evaluation and Treatment Rehabilitation  PERTINENT HISTORY: 52 year old female with a right nondisplaced humeral neck fracture after a fall from standing. She has interval callus formation. Given the fact that she is healing well I would like her to work on active and passive range of motion at this point. Will plan for physical therapy for range of motion. She will not do any heavy lifting until her next appointment in 6 weeks   ONSET DATE: 10/02/22   PRECAUTIONS: Shoulder   SUBJECTIVE:  SUBJECTIVE STATEMENT: Pt presents to PT with no current pain/ Had a good follow up visit with MD yesterday and can now begin strengthening.    PAIN:  Are you having pain? Yes: NPRS scale: 5/10 Pain location: R shoulder Pain description: ache Aggravating factors: movement and use Relieving factors: rest and tylenol   OBJECTIVE: (objective measures completed at initial evaluation unless otherwise dated)   PATIENT SURVEYS:  FOTO 54(64 predicted)   POSTURE: Rounded and forward shoulders, forward head   UPPER EXTREMITY ROM:    A/PROM Right eval Right (PROM) 12/03/22  Shoulder flexion 100/117d 130  Shoulder extension 30   Shoulder abduction 90/80d   Shoulder adduction      Shoulder internal rotation 70d PROM    Shoulder external rotation 40d PROM    Elbow flexion      Elbow extension      Wrist flexion      Wrist extension      Wrist ulnar deviation      Wrist radial deviation      Wrist pronation      Wrist supination      (Blank rows = not tested)   UPPER EXTREMITY  MMT:   MMT Right eval Left eval  Shoulder flexion 3 5  Shoulder extension 3 5  Shoulder abduction 3 5  Shoulder adduction      Shoulder internal rotation      Shoulder external rotation      Middle trapezius 4    Lower trapezius 4    Elbow flexion 4    Elbow extension 4    Wrist flexion 4    Wrist extension 4    Wrist ulnar deviation      Wrist radial deviation      Wrist pronation      Wrist supination      Grip strength (lbs) 68 63  (Blank rows = not tested)             TODAY'S TREATMENT:     [facility] Adult PT Treatment:                                                DATE: 12/10/22 Therapeutic Exercise: UBE lvl 1.0 x 4 min fwd for ROM only Standing row 3x10 blue band Shoulder ext 2x10 blue band R shoulder IR/ER x 10 - 5" hold Pulleys shoulder flexion x 2 min  R shoulder wall slide 2x10 flex/abd each (abd difficult) Supine dow flexion 3x10 AAROM Supine dow ER AAROM 2x15 R Manual Therapy: PROM ER/IR to R shoulder  [facility] Adult PT Treatment:                                                DATE: 12/08/22 Therapeutic Exercise: Pulleys shoulder flexion x 2 min  UBE lvl 1.0 x 2 min fwd for ROM only Seated scapular retraction 2x10 Supine dow flexion 3x10 AAROM Supine dow ER AAROM 2x15 R Manual Therapy: PROM ER/IR to R shoulder  [facility] Adult PT Treatment:  DATE: 12/03/22 Therapeutic Exercise: Seated scapular retraction 2x10 Supine dow flexion 2x10 AAROM Supine dow ER AAROM 2x15 R Pulley's shoulder flexion/scap x 2 min each Manual Therapy: PROM ER/IR, abd/add, flex/ext - within pts pain tolerance   [facility] Adult PT Treatment:                                                DATE: 12/01/22 Therapeutic Exercise: UBE lvl 1.0 (no resistance) x 4 min for AAROM while taking subjective Seated scapular retraction 2x10 Supine/seated dow ER AAROM 2x15 R  PATIENT EDUCATION: Education details: Discussed eval findings, rehab rationale and POC and  patient is in agreement  Person educated: Patient Education method: Explanation Education comprehension: verbalized understanding   HOME EXERCISE PROGRAM: Access Code: [email or web address] URL: [email or web address] Date: 12/10/2022 Prepared by: Edwinna Areola  Exercises - Seated Shoulder Flexion Towel Slide at Table Top Full Range of Motion  - 3 x daily - 5 x weekly - 1 sets - 10 reps - 3s hold - Seated Shoulder Scaption Slide at Table Top with Forearm in Neutral  - 3 x daily - 5 x weekly - 1 sets - 10 reps - 3s hold - Seated Shoulder Shrugs  - 3 x daily - 5 x weekly - 1 sets - 10 reps - Seated Scapular Retraction  - 3 x daily - 5 x weekly - 1 sets - 10 reps - Standing Shoulder Row with Anchored Resistance  - 1 x daily - 7 x weekly - 3 sets - 10 reps - blue band hold - Shoulder extension with resistance - Neutral  - 1 x daily - 7 x weekly - 2 sets - 10 reps - blue band hold - Standing Isometric Shoulder External Rotation with Doorway and Towel Roll  - 1 x daily - 7 x weekly - 2 sets - 10 reps - 5 sec hold - Standing Isometric Shoulder Internal Rotation with Towel Roll at Doorway  - 1 x daily - 7 x weekly - 2 sets - 10 reps - 5 sec hold   ASSESSMENT:   CLINICAL IMPRESSION: Pt was able to complete all prescribed exercises with no adverse effect. Continued to progress PROM and AAROM exercises and added initial shoulder strengthening exercises today. HEP updated for addition of initial shoulder strengthening. She continues to progress well with therapy, will continue per POC.     OBJECTIVE IMPAIRMENTS: decreased activity tolerance, decreased knowledge of condition, decreased mobility, decreased ROM, decreased strength, increased fascial restrictions, impaired perceived functional ability, impaired UE functional use, postural dysfunction, and pain.    ACTIVITY LIMITATIONS: carrying, lifting, reach over head, and caring for others   PARTICIPATION LIMITATIONS: meal prep, cleaning, laundry,  driving, shopping, and community activity   PERSONAL FACTORS: Age, Fitness, Past/current experiences, and Time since onset of injury/illness/exacerbation are also affecting patient's functional outcome.      GOALS: Goals reviewed with patient? Yes   SHORT TERM GOALS: Target date: 12/01/2022   Patient to demonstrate independence in HEP  Baseline: [id number] Goal status: MET Pt reports 2x daily adherence 12/01/22   2.  120d AROM flexion and abduction R shoulder Baseline:  A/PROM Right eval Left eval  Shoulder flexion 100/117d 160  Shoulder extension 30 45  Shoulder abduction 90/80d 160    Goal status: INITIAL       LONG TERM GOALS: Target date: 12/22/2022  Increase AROM R shoulder to 160d Baseline:  A/PROM Right eval Left eval  Shoulder flexion 100/117d 160  Shoulder extension 30 45  Shoulder abduction 90/80d 160    Goal status: INITIAL   2.  Increase R shoulder strength to 4/5 when appropriate for strengthening Baseline:  MMT Right eval Left eval  Shoulder flexion 3 5  Shoulder extension 3 5  Shoulder abduction 3 5    Goal status: INITIAL   3.  Resolve R scapular dyskinesis with AROM Baseline: R scapular winging noted with AROM Goal status: INITIAL   4.  Decrease pain to 4/10 at worst Baseline: 7/10 at worst Goal status: INITIAL   5.  Increase FOTO score to 64 Baseline: 54 Goal status: INITIAL       PLAN:   PT FREQUENCY: 2x/week   PT DURATION: 6 weeks   PLANNED INTERVENTIONS: Therapeutic exercises, Therapeutic activity, Neuromuscular re-education, Balance training, Gait training, Patient/Family education, Self Care, Joint mobilization, Dry Needling, Manual therapy, and Re-evaluation   PLAN FOR NEXT SESSION: HEP review  and update, PROM, scapular mobilization and strengthening of periscapular muscles.   Eloy End, PT 12/10/2022, 11:25 AM

## 2022-12-10 ENCOUNTER — Ambulatory Visit: Payer: Medicare PPO | Attending: Orthopaedic Surgery

## 2022-12-10 DIAGNOSIS — M6281 Muscle weakness (generalized): Secondary | ICD-10-CM | POA: Insufficient documentation

## 2022-12-10 DIAGNOSIS — M25611 Stiffness of right shoulder, not elsewhere classified: Secondary | ICD-10-CM | POA: Insufficient documentation

## 2022-12-15 ENCOUNTER — Ambulatory Visit: Payer: Medicare PPO

## 2022-12-17 ENCOUNTER — Ambulatory Visit: Payer: Medicare PPO

## 2022-12-30 ENCOUNTER — Ambulatory Visit: Payer: Medicare PPO

## 2023-01-20 ENCOUNTER — Ambulatory Visit (HOSPITAL_BASED_OUTPATIENT_CLINIC_OR_DEPARTMENT_OTHER): Payer: Medicare HMO | Admitting: Orthopaedic Surgery

## 2023-01-20 ENCOUNTER — Ambulatory Visit (INDEPENDENT_AMBULATORY_CARE_PROVIDER_SITE_OTHER): Payer: Medicare HMO

## 2023-01-20 DIAGNOSIS — S42294A Other nondisplaced fracture of upper end of right humerus, initial encounter for closed fracture: Secondary | ICD-10-CM | POA: Diagnosis not present

## 2023-01-20 NOTE — Progress Notes (Signed)
Follow-up evaluation     Interval History:   01/20/2023: Veronica Cortez presents today for follow-up of her right proximal humerus fracture.  Overall she is doing extremely well.  She did unfortunately have a recent fall where she did not land directly on her coccyx.  This has been sore and she is required a donut for this.  Veronica Cortez presents today with a right minimally displaced proximal humerus fracture after a fall from standing.  She states that her cat tripped her.  This was the previous weekend.  She was seen in the emergency room placed in a sling.  She has been nonweightbearing.  She is here today for further follow-up.  She has been in fairly significant pain with limited ability to sleep.   PMH/PSH/Family History/Social History/Meds/Allergies:    Past Medical History:  Diagnosis Date   Asthma    Depression    Diabetes mellitus without complication (HCC)    Fibromyalgia    GERD (gastroesophageal reflux disease)    Hypertension    IBD (inflammatory bowel disease)    Migraine    Neuropathy    Sleep apnea    Past Surgical History:  Procedure Laterality Date   ABDOMINAL HYSTERECTOMY     BACK SURGERY  2013   ENDOSCOPIC PLANTAR FASCIOTOMY     HAND SURGERY     HIP PINNING,CANNULATED Right 10/18/2021   Procedure: CANNULATED HIP PINNING;  Surgeon: [name], MD;  Location: [facility] OR;  Service: Orthopedics;  Laterality: Right;   TUBAL LIGATION     Social History   Socioeconomic History   Marital status: Single    Spouse name: Not on file   Number of children: Not on file   Years of education: Not on file   Highest education level: Not on file  Occupational History   Not on file  Tobacco Use   Smoking status: Former    Types: Cigarettes    Quit date: 2012    Years since quitting: 12.4   Smokeless tobacco: Never  Vaping Use   Vaping Use: Never used  Substance and Sexual Activity   Alcohol use: Yes    Alcohol/week: 2.0 standard drinks  of alcohol    Types: 2 Glasses of wine per week   Drug use: Never   Sexual activity: Not on file  Other Topics Concern   Not on file  Social History Narrative   Not on file   Social Determinants of Health   Financial Resource Strain: Not on file  Food Insecurity: Not on file  Transportation Needs: Not on file  Physical Activity: Not on file  Stress: Not on file  Social Connections: Not on file   Family History  Problem Relation Age of Onset   Breast cancer Mother    Kidney failure Father    Breast cancer Maternal Grandmother    Breast cancer Maternal Great-grandmother    Allergies  Allergen Reactions   Acetazolamide Other (See Comments)    unknown   Benadryl [Diphenhydramine] Other (See Comments)    unknown   Clarithromycin Other (See Comments)    unknown   Compazine [Prochlorperazine] Other (See Comments)    unknown   Fentanyl Other (See Comments)    unknown   Lamictal [Lamotrigine] Other (See Comments)    unknown   Other Other (See Comments)  Triptans and statins; unknown   Reglan [Metoclopramide] Other (See Comments)    unknown   Topiramate Other (See Comments)    unknown   Current Outpatient Medications  Medication Sig Dispense Refill   ADVAIR HFA 115-21 MCG/ACT inhaler Inhale 2 puffs into the lungs 2 (two) times daily.     AJOVY 225 MG/1.5ML SOAJ Inject 1.5 mLs into the skin every 30 (thirty) days.     albuterol (VENTOLIN HFA) 108 (90 Base) MCG/ACT inhaler Inhale 2 puffs into the lungs every 4 (four) hours as needed for wheezing or shortness of breath.     amitriptyline (ELAVIL) 100 MG tablet Take 100 mg by mouth at bedtime.     aspirin-acetaminophen-caffeine (EXCEDRIN MIGRAINE) 250-250-65 MG tablet Take 2 tablets by mouth every 6 (six) hours as needed for headache.     atenolol (TENORMIN) 25 MG tablet Take 25 mg by mouth daily.     dicyclomine (BENTYL) 10 MG capsule Take 1 capsule (10 mg total) by mouth 3 (three) times daily before meals. 90 capsule 0    enoxaparin (LOVENOX) 40 MG/0.4ML injection Inject 0.4 mLs (40 mg total) into the skin daily for 14 days. 5.6 mL 0   FLUoxetine (PROZAC) 40 MG capsule Take 40 mg by mouth daily.     HYDROcodone-acetaminophen (NORCO/VICODIN) 5-325 MG tablet Take 1 tablet by mouth every 6 (six) hours as needed for moderate pain. 30 tablet 0   HYDROcodone-acetaminophen (NORCO/VICODIN) 5-325 MG tablet Take 1 tablet by mouth every 6 (six) hours as needed for moderate pain. 30 tablet 0   Insulin Aspart FlexPen (NOVOLOG) 100 UNIT/ML Inject 10-16 Units into the skin 3 (three) times daily.     JANUVIA 100 MG tablet Take 100 mg by mouth daily.     LANTUS SOLOSTAR 100 UNIT/ML Solostar Pen Inject 30 Units into the skin at bedtime.     Melatonin 10 MG TABS Take 10 mg by mouth at bedtime.     montelukast (SINGULAIR) 10 MG tablet Take 10 mg by mouth daily.     Omega-3 Fatty Acids (FISH OIL PO) Take 1 capsule by mouth daily.     omeprazole (PRILOSEC) 20 MG capsule Take 20 mg by mouth 2 (two) times daily.     ondansetron (ZOFRAN-ODT) 8 MG disintegrating tablet Take 1 tablet (8 mg total) by mouth every 8 (eight) hours as needed for nausea or vomiting. 20 tablet 0   oxyCODONE-acetaminophen (PERCOCET/ROXICET) 5-325 MG tablet Take 1 tablet by mouth every 6 (six) hours as needed for severe pain. 15 tablet 0   Probiotic Product (PROBIOTIC PO) Take 1 capsule by mouth daily.     promethazine (PHENERGAN) 25 MG tablet Take 25 mg by mouth daily as needed for nausea/vomiting.     Riboflavin (B2 PO) Take 1 tablet by mouth daily.     rosuvastatin (CRESTOR) 10 MG tablet Take 10 mg by mouth at bedtime.     topiramate (TOPAMAX) 200 MG tablet Take 200 mg by mouth 2 (two) times daily.     UBRELVY 100 MG TABS Take 100 mg by mouth 2 (two) times daily as needed for migraine.     VITAMIN E PO Take 1 tablet by mouth daily.     Current Facility-Administered Medications  Medication Dose Route Frequency Provider Last Rate Last Admin   acetaminophen  (TYLENOL) tablet 1,000 mg  1,000 mg Oral Q8H Brimage, Vondra, DO       No results found.  Review of Systems:   A ROS was performed  including pertinent positives and negatives as documented in the HPI.   Musculoskeletal Exam:    There were no vitals taken for this visit.  Right shoulder is minimally tender to palpation.  Active range of motion is forward elevation of 160 degrees compared to 170 on contralateral side.  External rotation is to 50 degrees compared to 70 on the contralateral side.  Internal rotation is to L3.  Imaging:    X-ray right shoulder 3 views: Nondisplaced humeral neck fracture with interval callus formation  I personally reviewed and interpreted the radiographs.   Assessment:   52 year old female with a right nondisplaced humeral neck fracture after a fall from standing.  Overall her x-rays do show healed fracture.  She is doing quite well from a range of motion perspective.  I will plan to see her back as needed  Plan :    -Return to clinic as needed     I personally saw and evaluated the patient, and participated in the management and treatment plan.  [name], MD Attending Physician, Orthopedic Surgery  This document was dictated using Dragon voice recognition software. A reasonable attempt at proof reading has been made to minimize errors.

## 2023-03-26 ENCOUNTER — Inpatient Hospital Stay: Admission: RE | Admit: 2023-03-26 | Payer: Medicare PPO | Source: Ambulatory Visit

## 2023-04-23 ENCOUNTER — Emergency Department (HOSPITAL_BASED_OUTPATIENT_CLINIC_OR_DEPARTMENT_OTHER): Payer: Medicare HMO

## 2023-04-23 ENCOUNTER — Other Ambulatory Visit: Payer: Self-pay

## 2023-04-23 ENCOUNTER — Emergency Department (HOSPITAL_BASED_OUTPATIENT_CLINIC_OR_DEPARTMENT_OTHER)
Admission: EM | Admit: 2023-04-23 | Discharge: 2023-04-23 | Disposition: A | Discharge status: Home / Self Care | Payer: Medicare HMO | Attending: Emergency Medicine | Admitting: Emergency Medicine

## 2023-04-23 ENCOUNTER — Encounter (HOSPITAL_BASED_OUTPATIENT_CLINIC_OR_DEPARTMENT_OTHER): Payer: Self-pay | Admitting: Emergency Medicine

## 2023-04-23 DIAGNOSIS — W010XXA Fall on same level from slipping, tripping and stumbling without subsequent striking against object, initial encounter: Secondary | ICD-10-CM | POA: Diagnosis not present

## 2023-04-23 DIAGNOSIS — S7001XA Contusion of right hip, initial encounter: Secondary | ICD-10-CM

## 2023-04-23 DIAGNOSIS — S79911A Unspecified injury of right hip, initial encounter: Secondary | ICD-10-CM | POA: Diagnosis present

## 2023-04-23 DIAGNOSIS — Z7982 Long term (current) use of aspirin: Secondary | ICD-10-CM | POA: Diagnosis not present

## 2023-04-23 MED ORDER — METHOCARBAMOL 750 MG PO TABS: 750.0000 mg | ORAL_TABLET | Freq: Three times a day (TID) | ORAL | 0 refills | Status: DC | PRN

## 2023-04-23 MED ORDER — TRAMADOL HCL 50 MG PO TABS
50.0000 mg | ORAL_TABLET | Freq: Once | ORAL | Status: AC
  Administered 2023-04-23: 50 mg via ORAL
  Filled 2023-04-23: qty 1

## 2023-04-23 NOTE — Discharge Instructions (Signed)
It was our pleasure to provide your ER care today - we hope that you feel better.  Take acetaminophen or ibuprofen as need for pain. You may also take robaxin as need for muscle pain/spasm - no driving when taking.  Follow up with your doctor/orthopedist in 1-2 weeks if symptoms fail to improve/resolve.  Return to ER if worse, new symptoms, fevers, new/severe pain, or other concern.

## 2023-04-23 NOTE — ED Triage Notes (Signed)
Pt presents after fall on Sunday on the driveway landing on her left hip. Hx of sx on that hip with pin placement 2 years ago.  Pt states she has had progressively worsening pain to right groin area radiating down to her right foot.  Pt has pain with sitting and walking.

## 2023-04-23 NOTE — ED Provider Notes (Signed)
[place] EMERGENCY DEPARTMENT AT [facility] Provider Note   CSN: [id number] Arrival date & time: 04/23/23  1626     History  Chief Complaint  Patient presents with   Groin Pain    Veronica Cortez is a 52 y.o. female.  Pt c/o fall while mowing yard give days ago. Indicates mechanical fall, tripping after stepping on uneven area in yard. No faintness or dizziness prior to fall. No loc w fall. Denies head injury or headache. Post fall, has noted right hip pain, worse w certain movements and weight bearing. Is able to walk. No neck or back pain. No radicular pain. No knee pain. No RLE swelling. No numbness or weakness. Remote hx right hip surgery, '3 pins'.   The history is provided by the patient, medical records and a significant other.  Groin Pain Pertinent negatives include no chest pain, no abdominal pain, no headaches and no shortness of breath.       Home Medications Prior to Admission medications   Medication Sig Start Date End Date Taking? Authorizing Provider  ADVAIR HFA 115-21 MCG/ACT inhaler Inhale 2 puffs into the lungs 2 (two) times daily. 03/20/21   [provider]  AJOVY 225 MG/1.5ML SOAJ Inject 1.5 mLs into the skin every 30 (thirty) days. 04/09/21   [provider]  albuterol (VENTOLIN HFA) 108 (90 Base) MCG/ACT inhaler Inhale 2 puffs into the lungs every 4 (four) hours as needed for wheezing or shortness of breath. 03/17/21   [provider]  amitriptyline (ELAVIL) 100 MG tablet Take 100 mg by mouth at bedtime. 04/08/21   [provider]  aspirin-acetaminophen-caffeine (EXCEDRIN MIGRAINE) [phone] MG tablet Take 2 tablets by mouth every 6 (six) hours as needed for headache.    [provider]  atenolol (TENORMIN) 25 MG tablet Take 25 mg by mouth daily. 04/08/21   [provider]  dicyclomine (BENTYL) 10 MG capsule Take 1 capsule (10 mg total) by mouth 3 (three) times daily before meals. 04/18/21 10/18/21   [name], MD  enoxaparin (LOVENOX) 40 MG/0.4ML injection Inject 0.4 mLs (40 mg total) into the skin daily for 14 days. 10/20/21 11/03/21  Katha Cabal, DO  FLUoxetine (PROZAC) 40 MG capsule Take 40 mg by mouth daily. 04/08/21   [provider]  HYDROcodone-acetaminophen (NORCO/VICODIN) 5-325 MG tablet Take 1 tablet by mouth every 6 (six) hours as needed for moderate pain. 11/06/21   [name], MD  HYDROcodone-acetaminophen (NORCO/VICODIN) 5-325 MG tablet Take 1 tablet by mouth every 6 (six) hours as needed for moderate pain. 10/01/22   [name], MD  Insulin Aspart FlexPen (NOVOLOG) 100 UNIT/ML Inject 10-16 Units into the skin 3 (three) times daily. 04/11/21   [provider]  JANUVIA 100 MG tablet Take 100 mg by mouth daily. 04/08/21   [provider]  LANTUS SOLOSTAR 100 UNIT/ML Solostar Pen Inject 30 Units into the skin at bedtime. 03/18/21   [provider]  Melatonin 10 MG TABS Take 10 mg by mouth at bedtime.    [provider]  montelukast (SINGULAIR) 10 MG tablet Take 10 mg by mouth daily. 03/17/21   [provider]  Omega-3 Fatty Acids (FISH OIL PO) Take 1 capsule by mouth daily.    [provider]  omeprazole (PRILOSEC) 20 MG capsule Take 20 mg by mouth 2 (two) times daily. 04/08/21   [provider]  ondansetron (ZOFRAN-ODT) 8 MG disintegrating tablet Take 1 tablet (8 mg total) by mouth every 8 (  eight) hours as needed for nausea or vomiting. 09/13/22   Theron Arista, PA-C  oxyCODONE-acetaminophen (PERCOCET/ROXICET) 5-325 MG tablet Take 1 tablet by mouth every 6 (six) hours as needed for severe pain. 09/13/22   Theron Arista, PA-C  Probiotic Product (PROBIOTIC PO) Take 1 capsule by mouth daily.    [provider]  promethazine (PHENERGAN) 25 MG tablet Take 25 mg by mouth daily as needed for nausea/vomiting. 04/08/21   [provider]  Riboflavin (B2 PO) Take 1 tablet by mouth daily.    [provider]  rosuvastatin (CRESTOR) 10 MG tablet Take 10 mg by mouth at bedtime. 04/08/21   [provider]  topiramate (TOPAMAX) 200 MG tablet Take 200 mg by mouth 2 (two) times daily. 03/18/21   [provider]  UBRELVY 100 MG TABS Take 100 mg by mouth 2 (two) times daily as needed for migraine. 04/09/21   [provider]  VITAMIN E PO Take 1 tablet by mouth daily.    [provider]      Allergies    Acetazolamide, Benadryl [diphenhydramine], Clarithromycin, Compazine [prochlorperazine], Fentanyl, Lamictal [lamotrigine], Other, Reglan [metoclopramide], and Topiramate    Review of Systems   Review of Systems  Constitutional:  Negative for fever.  Respiratory:  Negative for shortness of breath.   Cardiovascular:  Negative for chest pain.  Gastrointestinal:  Negative for abdominal pain, nausea and vomiting.  Genitourinary:  Negative for flank pain.  Musculoskeletal:  Negative for back pain and neck pain.  Skin:  Negative for wound.  Neurological:  Negative for weakness, numbness and headaches.    Physical Exam Updated Vital Signs BP 133/87 (BP Location: Right Arm)   Pulse 74   Temp 97.6 F (36.4 C) (Oral)   Resp 18   SpO2 100%  Physical Exam Vitals and nursing note reviewed.  Constitutional:      Appearance: Normal appearance. She is well-developed.  HENT:     Head: Atraumatic.     Nose: Nose normal.     Mouth/Throat:     Mouth: Mucous membranes are moist.  Eyes:     General: No scleral icterus.    Conjunctiva/sclera: Conjunctivae normal.     Pupils: Pupils are equal, round, and reactive to light.  Neck:     Trachea: No tracheal deviation.  Cardiovascular:     Rate and Rhythm: Normal rate.     Pulses: Normal pulses.  Pulmonary:     Effort: Pulmonary effort is normal. No respiratory distress.  Abdominal:     General: There is no distension.     Palpations: Abdomen is soft. There is no mass.     Tenderness: There is no abdominal  tenderness.  Musculoskeletal:        General: No swelling.     Cervical back: Normal range of motion and neck supple. No rigidity or tenderness. No muscular tenderness.     Comments: CTLS spine, non tender, aligned, no step off. Mild tenderness right hip, no sts. Good passive rom right hip and knee without pain. No RLE swelling, and RLE is of normal color and warmth, with intact distal pulses.  Skin:    General: Skin is warm and dry.     Findings: No rash.  Neurological:     Mental Status: She is alert.     Comments: Alert, speech normal. GCS 15. Motor/sens grossly intact bil.   Psychiatric:        Mood and Affect: Mood normal.  ED Results / Procedures / Treatments   Labs (all labs ordered are listed, but only abnormal results are displayed) Labs Reviewed - No data to display  EKG None  Radiology DG HIP UNILAT W OR W/O PELVIS 2-3 VIEWS RIGHT  Result Date: 04/23/2023 CLINICAL DATA:  Fall, hip pain EXAM: DG HIP (WITH OR WITHOUT PELVIS) 2-3V RIGHT COMPARISON:  01/15/2022 FINDINGS: Three cannulated screw fixation of the previous right subcapital femoral neck fracture noted, new fracture or acute complicating feature. IMPRESSION: 1. No acute findings. 2. Three cannulated screw fixation of the previous right subcapital femoral neck fracture. Electronically Signed   By: Gaylyn Rong M.D.   On: 04/23/2023 19:10    Procedures Procedures    Medications Ordered in ED Medications  traMADol (ULTRAM) tablet 50 mg (50 mg Oral Given 04/23/23 1718)    ED Course/ Medical Decision Making/ A&P                                 Medical Decision Making Problems Addressed: Contusion of right hip, initial encounter: acute illness or injury with systemic symptoms that poses a threat to life or bodily functions Fall from slip, trip, or stumble, initial encounter: acute illness or injury with systemic symptoms that poses a threat to life or bodily functions  Amount and/or Complexity of  Data Reviewed Independent Historian: spouse    Details: hx Radiology: ordered and independent interpretation performed. Decision-making details documented in ED Course.  Risk Prescription drug management.   Imaging ordered.   Reviewed nursing notes and prior charts for additional history.   Ultram po.  Xrays reviewed/interpreted by me - no new fx noted.   Pt comfortable appearing, discussed xr. Pt appears stable for d/c.  Rec pcp/ortho f/u.           Final Clinical Impression(s) / ED Diagnoses Final diagnoses:  None    Rx / DC Orders ED Discharge Orders     None         Cathren Laine, MD 04/23/23 1925

## 2023-05-07 ENCOUNTER — Ambulatory Visit (HOSPITAL_BASED_OUTPATIENT_CLINIC_OR_DEPARTMENT_OTHER): Payer: Medicare HMO | Admitting: Orthopaedic Surgery

## 2023-09-28 ENCOUNTER — Other Ambulatory Visit: Payer: Self-pay | Admitting: Sports Medicine

## 2023-09-28 DIAGNOSIS — E28319 Asymptomatic premature menopause: Secondary | ICD-10-CM

## 2023-09-28 DIAGNOSIS — Z8781 Personal history of (healed) traumatic fracture: Secondary | ICD-10-CM

## 2023-09-28 DIAGNOSIS — Z90710 Acquired absence of both cervix and uterus: Secondary | ICD-10-CM

## 2023-09-28 DIAGNOSIS — M859 Disorder of bone density and structure, unspecified: Secondary | ICD-10-CM

## 2023-09-28 DIAGNOSIS — W1789XA Other fall from one level to another, initial encounter: Secondary | ICD-10-CM

## 2023-09-28 DIAGNOSIS — S42294A Other nondisplaced fracture of upper end of right humerus, initial encounter for closed fracture: Secondary | ICD-10-CM

## 2023-09-29 ENCOUNTER — Other Ambulatory Visit: Payer: Medicare PPO

## 2023-10-07 ENCOUNTER — Ambulatory Visit (HOSPITAL_BASED_OUTPATIENT_CLINIC_OR_DEPARTMENT_OTHER)
Admission: RE | Admit: 2023-10-07 | Discharge: 2023-10-07 | Disposition: A | Discharge status: Home / Self Care | Payer: 59 | Source: Ambulatory Visit | Attending: Sports Medicine | Admitting: Sports Medicine

## 2023-10-07 DIAGNOSIS — Z8781 Personal history of (healed) traumatic fracture: Secondary | ICD-10-CM | POA: Diagnosis present

## 2023-10-07 DIAGNOSIS — W1789XA Other fall from one level to another, initial encounter: Secondary | ICD-10-CM | POA: Insufficient documentation

## 2023-10-07 DIAGNOSIS — S42294A Other nondisplaced fracture of upper end of right humerus, initial encounter for closed fracture: Secondary | ICD-10-CM | POA: Diagnosis present

## 2023-10-07 DIAGNOSIS — E28319 Asymptomatic premature menopause: Secondary | ICD-10-CM | POA: Diagnosis not present

## 2023-10-07 DIAGNOSIS — M859 Disorder of bone density and structure, unspecified: Secondary | ICD-10-CM | POA: Insufficient documentation

## 2023-10-07 DIAGNOSIS — Z90710 Acquired absence of both cervix and uterus: Secondary | ICD-10-CM | POA: Diagnosis not present

## 2023-10-29 ENCOUNTER — Emergency Department (HOSPITAL_BASED_OUTPATIENT_CLINIC_OR_DEPARTMENT_OTHER)
Admission: EM | Admit: 2023-10-29 | Discharge: 2023-10-29 | Disposition: A | Discharge status: Home / Self Care | Attending: Emergency Medicine | Admitting: Emergency Medicine

## 2023-10-29 ENCOUNTER — Encounter (HOSPITAL_BASED_OUTPATIENT_CLINIC_OR_DEPARTMENT_OTHER): Payer: Self-pay | Admitting: Emergency Medicine

## 2023-10-29 ENCOUNTER — Emergency Department (HOSPITAL_BASED_OUTPATIENT_CLINIC_OR_DEPARTMENT_OTHER): Admitting: Radiology

## 2023-10-29 ENCOUNTER — Other Ambulatory Visit: Payer: Self-pay

## 2023-10-29 DIAGNOSIS — S199XXA Unspecified injury of neck, initial encounter: Secondary | ICD-10-CM | POA: Diagnosis present

## 2023-10-29 DIAGNOSIS — Y92410 Unspecified street and highway as the place of occurrence of the external cause: Secondary | ICD-10-CM | POA: Insufficient documentation

## 2023-10-29 DIAGNOSIS — S134XXA Sprain of ligaments of cervical spine, initial encounter: Secondary | ICD-10-CM | POA: Insufficient documentation

## 2023-10-29 DIAGNOSIS — R519 Headache, unspecified: Secondary | ICD-10-CM | POA: Insufficient documentation

## 2023-10-29 MED ORDER — DROPERIDOL 2.5 MG/ML IJ SOLN: 2.5000 mg | Freq: Once | INTRAMUSCULAR | Status: DC

## 2023-10-29 MED ORDER — KETOROLAC TROMETHAMINE 15 MG/ML IJ SOLN
15.0000 mg | Freq: Once | INTRAMUSCULAR | Status: AC
  Administered 2023-10-29: 15 mg via INTRAVENOUS
  Filled 2023-10-29: qty 1

## 2023-10-29 MED ORDER — SODIUM CHLORIDE 0.9 % IV SOLN
25.0000 mg | Freq: Four times a day (QID) | INTRAVENOUS | Status: DC | PRN
  Filled 2023-10-29: qty 1

## 2023-10-29 MED ORDER — METHOCARBAMOL 500 MG PO TABS
500.0000 mg | ORAL_TABLET | Freq: Once | ORAL | Status: AC
  Administered 2023-10-29: 500 mg via ORAL
  Filled 2023-10-29: qty 1

## 2023-10-29 MED ORDER — METHOCARBAMOL 500 MG PO TABS: 500.0000 mg | ORAL_TABLET | Freq: Two times a day (BID) | ORAL | 0 refills | Status: DC
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
## 2023-10-29 NOTE — Discharge Instructions (Signed)
 Like we discussed, you likely have a concussion.  This is essentially a bruise on your brain.  Often times in people have a concussion, they can feel headaches, nauseated, or feel sensitive to light.  It can be difficult to say what will make the symptoms worse, but that is your brains way of saying that you need to stop what you are doing and rest.  You can take Motrin, Tylenol and Zofran for this.  Most people symptoms will improve over days to weeks.  Return to the emergency room if you lose, has noticed, or develop persistent vomiting.
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
## 2023-11-10 ENCOUNTER — Other Ambulatory Visit (HOSPITAL_BASED_OUTPATIENT_CLINIC_OR_DEPARTMENT_OTHER): Payer: Self-pay | Admitting: Nurse Practitioner

## 2023-11-10 DIAGNOSIS — Z1231 Encounter for screening mammogram for malignant neoplasm of breast: Secondary | ICD-10-CM

## 2023-11-18 ENCOUNTER — Encounter (HOSPITAL_COMMUNITY): Payer: Self-pay

## 2023-11-18 ENCOUNTER — Emergency Department (HOSPITAL_COMMUNITY)

## 2023-11-18 ENCOUNTER — Other Ambulatory Visit: Payer: Self-pay

## 2023-11-18 ENCOUNTER — Emergency Department (HOSPITAL_COMMUNITY)
Admission: EM | Admit: 2023-11-18 | Discharge: 2023-11-18 | Disposition: A | Discharge status: Home / Self Care | Attending: Emergency Medicine | Admitting: Emergency Medicine

## 2023-11-18 DIAGNOSIS — W010XXA Fall on same level from slipping, tripping and stumbling without subsequent striking against object, initial encounter: Secondary | ICD-10-CM | POA: Diagnosis not present

## 2023-11-18 DIAGNOSIS — S32010A Wedge compression fracture of first lumbar vertebra, initial encounter for closed fracture: Secondary | ICD-10-CM | POA: Diagnosis not present

## 2023-11-18 DIAGNOSIS — S3992XA Unspecified injury of lower back, initial encounter: Secondary | ICD-10-CM | POA: Diagnosis present

## 2023-11-18 MED ORDER — ONDANSETRON 4 MG PO TBDP
4.0000 mg | ORAL_TABLET | Freq: Once | ORAL | Status: AC
  Administered 2023-11-18: 4 mg via ORAL
  Filled 2023-11-18: qty 1

## 2023-11-18 MED ORDER — OXYCODONE-ACETAMINOPHEN 5-325 MG PO TABS
1.0000 | ORAL_TABLET | Freq: Once | ORAL | Status: AC
  Administered 2023-11-18: 1 via ORAL
  Filled 2023-11-18: qty 1

## 2023-11-18 MED ORDER — HYDROCODONE-ACETAMINOPHEN 5-325 MG PO TABS
2.0000 | ORAL_TABLET | Freq: Once | ORAL | Status: AC
  Administered 2023-11-18: 2 via ORAL
  Filled 2023-11-18: qty 2

## 2023-11-18 MED ORDER — OXYCODONE-ACETAMINOPHEN 5-325 MG PO TABS: 1.0000 | ORAL_TABLET | Freq: Once | ORAL | Status: DC

## 2023-11-18 MED ORDER — KETOROLAC TROMETHAMINE 15 MG/ML IJ SOLN
15.0000 mg | Freq: Once | INTRAMUSCULAR | Status: AC
  Administered 2023-11-18: 15 mg via INTRAMUSCULAR
  Filled 2023-11-18: qty 1

## 2023-11-18 MED ORDER — HYDROCODONE-ACETAMINOPHEN 5-325 MG PO TABS: 1.0000 | ORAL_TABLET | Freq: Four times a day (QID) | ORAL | 0 refills | Status: DC | PRN

## 2023-11-18 NOTE — ED Triage Notes (Signed)
 Pt coming in with a fall at around 830 this am.pt reports falling onto her bottom. Pt felt a crunch when she fell. Pt was able to stand and walk inside. Pt then sat in her chair at home pain became unbearable. Ems vitals  Pulse 86 Rr 18  100% on ra  150/92

## 2023-11-18 NOTE — ED Notes (Signed)
 Feel better with back brace, "ready to go", daughter at [facility]. VSS. Denies questions or needs. Given Rx, and referral.

## 2023-11-18 NOTE — Discharge Instructions (Addendum)
 You have been evaluated for your fall.  Imaging showed an L1 compression fracture.  Please wear back brace as provided, and follow-up with neurosurgery specialist outpatient as needed for further care.  You should initially take Tylenol and Motrin as needed for pain control, unless you were previously instructed to avoid these medications.  Should these medications not be enough, then you can take the prescribed pain medicines.  Be mindful that Norco contains Tylenol and.  Take pain medication as prescribed but be aware it can cause drowsiness.

## 2023-11-18 NOTE — ED Provider Notes (Signed)
 [place] [facility]GENC[facility] DEPARTMENT AT [facility] Provider Note   CSN: [id number] Arrival date & time: 11/18/23  8657     History  Chief Complaint  Patient presents with   Veronica Nip Veronica Cortez is a 53 y.o. female.  The history is provided by the patient and medical records. No language interpreter was used.  Fall     Veronica Cortez is a 53 yo female presenting to the ER today BIB EMS for lower back pain after a fall. Pt states that she tripped over her slipper and fell while she was letting her dogs out the morning. She states that she fell with her back against the wall, landing hard on her bottom and she felt a "crunch" in her lower back. She immediately felt an excruciating pain that radiated down bilateral posterior thighs. She denies head strike, LOC, loss of bladder or bowel function, saddle paraesthesia, or numbness or weakness of the BLE. Pt previously had a discectomy in 2013 of L4, L5 and S1 and states that she does experience some sciatica in her right leg but this pain is not similar or related.  Home Medications Prior to Admission medications   Medication Sig Start Date End Date Taking? Authorizing Provider  ADVAIR HFA 115-21 MCG/ACT inhaler Inhale 2 puffs into the lungs 2 (two) times daily. 03/20/21   [provider]  AJOVY 225 MG/1.5ML SOAJ Inject 1.5 mLs into the skin every 30 (thirty) days. 04/09/21   [provider]  albuterol (VENTOLIN HFA) 108 (90 Base) MCG/ACT inhaler Inhale 2 puffs into the lungs every 4 (four) hours as needed for wheezing or shortness of breath. 03/17/21   [provider]  amitriptyline (ELAVIL) 100 MG tablet Take 100 mg by mouth at bedtime. 04/08/21   [provider]  aspirin-acetaminophen-caffeine (EXCEDRIN MIGRAINE) 5011947258 MG tablet Take 2 tablets by mouth every 6 (six) hours as needed for headache.    [provider]  atenolol (TENORMIN) 25 MG tablet Take 25 mg by mouth daily. 04/08/21    [provider]  dicyclomine (BENTYL) 10 MG capsule Take 1 capsule (10 mg total) by mouth 3 (three) times daily before meals. 04/18/21 10/18/21  [name], MD  enoxaparin (LOVENOX) 40 MG/0.4ML injection Inject 0.4 mLs (40 mg total) into the skin daily for 14 days. 10/20/21 11/03/21  Katha Cabal, DO  FLUoxetine (PROZAC) 40 MG capsule Take 40 mg by mouth daily. 04/08/21   [provider]  HYDROcodone-acetaminophen (NORCO/VICODIN) 5-325 MG tablet Take 1 tablet by mouth every 6 (six) hours as needed for moderate pain. 11/06/21   [name], MD  HYDROcodone-acetaminophen (NORCO/VICODIN) 5-325 MG tablet Take 1 tablet by mouth every 6 (six) hours as needed for moderate pain. 10/01/22   [name], MD  Insulin Aspart FlexPen (NOVOLOG) 100 UNIT/ML Inject 10-16 Units into the skin 3 (three) times daily. 04/11/21   [provider]  JANUVIA 100 MG tablet Take 100 mg by mouth daily. 04/08/21   [provider]  LANTUS SOLOSTAR 100 UNIT/ML Solostar Pen Inject 30 Units into the skin at bedtime. 03/18/21   [provider]  Melatonin 10 MG TABS Take 10 mg by mouth at bedtime.    [provider]  methocarbamol (ROBAXIN) 500 MG tablet Take 1 tablet (500 mg total) by mouth 2 (two) times daily. 10/29/23   Anders Simmonds T, DO  montelukast (SINGULAIR) 10 MG tablet Take 10 mg by mouth daily. 03/17/21   [provider]  Omega-3 Fatty Acids (FISH OIL PO) Take 1 capsule by mouth daily.    [provider]  omeprazole (PRILOSEC) 20 MG capsule Take 20 mg by mouth 2 (two) times daily. 04/08/21   [provider]  ondansetron (ZOFRAN-ODT) 8 MG disintegrating tablet Take 1 tablet (8 mg total) by mouth every 8 (eight) hours as needed for nausea or vomiting. 09/13/22   Theron Arista, PA-C  oxyCODONE-acetaminophen (PERCOCET/ROXICET) 5-325 MG tablet Take 1 tablet by mouth every 6 (six) hours as needed for severe pain. 09/13/22   Theron Arista, PA-C   Probiotic Product (PROBIOTIC PO) Take 1 capsule by mouth daily.    [provider]  promethazine (PHENERGAN) 25 MG tablet Take 25 mg by mouth daily as needed for nausea/vomiting. 04/08/21   [provider]  Riboflavin (B2 PO) Take 1 tablet by mouth daily.    [provider]  rosuvastatin (CRESTOR) 10 MG tablet Take 10 mg by mouth at bedtime. 04/08/21   [provider]  topiramate (TOPAMAX) 200 MG tablet Take 200 mg by mouth 2 (two) times daily. 03/18/21   [provider]  UBRELVY 100 MG TABS Take 100 mg by mouth 2 (two) times daily as needed for migraine. 04/09/21   [provider]  VITAMIN E PO Take 1 tablet by mouth daily.    [provider]      Allergies    Acetazolamide, Benadryl [diphenhydramine], Clarithromycin, Compazine [prochlorperazine], Fentanyl, Lamictal [lamotrigine], Other, Reglan [metoclopramide], and Topiramate    Review of Systems   Review of Systems  All other systems reviewed and are negative.   Physical Exam Updated Vital Signs BP (!) 147/77   Pulse 73   Temp 98.2 F (36.8 C) (Oral)   Resp 18   Ht 5\' 8"  (1.727 m)   Wt 97 kg   SpO2 100%   BMI 32.52 kg/m  Physical Exam Vitals and nursing note reviewed.  Constitutional:      General: She is not in acute distress.    Appearance: She is well-developed.  HENT:     Head: Atraumatic.  Eyes:     Conjunctiva/sclera: Conjunctivae normal.  Pulmonary:     Effort: Pulmonary effort is normal.  Abdominal:     Palpations: Abdomen is soft.     Tenderness: There is no abdominal tenderness.  Musculoskeletal:        General: Tenderness (tenderness along entire lumbar and paralumbar region without crepitus or step offs) present.     Cervical back: Neck supple.     Comments: No pain to bilateral lower extremities  Skin:    Findings: No rash.  Neurological:     Mental Status: She is alert and oriented to person, place, and time.  Psychiatric:        Mood and  Affect: Mood normal.    ED Results / Procedures / Treatments   Labs (all labs ordered are listed, but only abnormal results are displayed) Labs Reviewed - No data to display  EKG None  Radiology DG Lumbar Spine Complete Result Date: 11/18/2023 CLINICAL DATA:  Lower back pain after fall today. EXAM: LUMBAR SPINE - COMPLETE 4+ VIEW COMPARISON:  October 18, 2021. FINDINGS: Mild compression deformity of L1 vertebral body is noted concerning for fracture of indeterminate age. No spondylolisthesis is noted. Mild degenerative disc disease is noted at L5-S1. Degenerative changes are seen involving posterior facet joints of L4-5 and L5-S1. IMPRESSION: Mild compression deformity of L1 vertebral body is noted concerning for fracture of indeterminate  age. MRI may be performed for further evaluation. Electronically Signed   By: Lupita Raider M.D.   On: 11/18/2023 13:43    Procedures Procedures    Medications Ordered in ED Medications  HYDROcodone-acetaminophen (NORCO/VICODIN) 5-325 MG per tablet 2 tablet (has no administration in time range)  oxyCODONE-acetaminophen (PERCOCET/ROXICET) 5-325 MG per tablet 1 tablet (1 tablet Oral Given 11/18/23 1053)  ondansetron (ZOFRAN-ODT) disintegrating tablet 4 mg (4 mg Oral Given 11/18/23 1206)  ketorolac (TORADOL) 15 MG/ML injection 15 mg (15 mg Intramuscular Given 11/18/23 1205)    ED Course/ Medical Decision Making/ A&P Clinical Course as of 11/18/23 1515  Thu Nov 18, 2023  1513 S- lost balance and hit her back when she fell, TTP on lumbar spine but no other deficits; XR with age-indeterminant frx [ ]  pend CT scans [AO]    Clinical Course User Index [AO] Barrett Shell, MD                                 Medical Decision Making Amount and/or Complexity of Data Reviewed Radiology: ordered.  Risk Prescription drug management.   BP (!) 147/77   Pulse 73   Temp 98.2 F (36.8 C) (Oral)   Resp 18   Ht 5\' 8"  (1.727 m)   Wt 97 kg   SpO2 100%   BMI  32.52 kg/m   11:17 AM  Veronica Cortez is a 53 yo female presenting to the ER today BIBEMS for lower back pain after a fall. Pt states that she tripped over her slipper and fell while she was letting her dogs out the morning. She states that she fell with her back against the wall, landing hard on her bottom and she felt a "crunch" in her lower back. She immediately felt an excruciating pain that radiated down bilateral posterior thighs. She denies head strike, LOC, loss of bladder or bowel function, saddle paraesthesia, or numbness or weakness of the BLE. Pt previously had a discectomy in 2013 of L4, L5 and S1 and states that she does experience some sciatica in her right leg but this pain is not similar or related.  Exam notable tenderness to entire lumbar and paralumbar spinal muscle however no obvious signs of deformity or overlying skin changes.  Normal strength in bilateral lower extremities.  L-spine x-ray obtained independent viewed interpreted by me with questionable L1 vertebral body fracture however radiologist felt that this is of indeterminate age.  Radiologist recommend MRI however patient is without any neurovascular compromise and after discussion with Dr. Rodena Medin, will obtain CT scan of lumbar spine for further assessment.  I have ordered LSO brace for support.  Additional opiate pain medication given.  Patient otherwise neurovascular intact.  She is able to ambulate.  Signout given to oncoming team who will follow-up on CT scan of lumbar spine and determine disposition.        Final Clinical Impression(s) / ED Diagnoses Final diagnoses:  Compression fracture of L1 lumbar vertebra, closed, initial encounter [facility])    Rx / DC Orders ED Discharge Orders          Ordered    HYDROcodone-acetaminophen (NORCO/VICODIN) 5-325 MG tablet  Every 6 hours PRN        11/18/23 1452              Fayrene Helper, PA-C 11/18/23 1515    Messick, Noralyn Pick, MD 11/18/23 [phone]

## 2023-11-18 NOTE — ED Notes (Signed)
 Pt alert, NAD, calm, interactive, resps e/u updated, pending CT results.

## 2023-11-18 NOTE — Progress Notes (Signed)
 Orthopedic Tech Progress Note Patient Details:  Melody Cirrincione 09-21-1970 [id number]  Applied a HANGER LSO BACK BRACE   Patient ID: Chasady Longwell, female   DOB: Jun 08, 1971, 53 y.o.   MRN: [id number]  Donald Pore 11/18/2023, 5:34 PM

## 2023-11-18 NOTE — ED Provider Notes (Signed)
 Assume care note  Physical Exam  BP (!) 147/77   Pulse 73   Temp 98.2 F (36.8 C) (Oral)   Resp 18   Ht 5\' 8"  (1.727 m)   Wt 97 kg   SpO2 100%   BMI 32.52 kg/m    ED Course / MDM   Clinical Course as of 11/18/23 1704  Thu Nov 18, 2023  1513 S- lost balance and hit her back when she fell, TTP on lumbar spine but no other deficits; XR with age-indeterminant frx [ ]  pend CT scans [AO]    Clinical Course User Index [AO] Lorain Robson, MD   Medical Decision Making Amount and/or Complexity of Data Reviewed Radiology: ordered.  Risk Prescription drug management.   CT imaging returned with an acute L1 compression fracture.  Prior provider ordered patient LSO brace as well as as needed pain medicines.  I reviewed workup and assessment today with the patient.  Patient is agreeable to pain management at home with outpatient follow-up. Denies any paresthesias or leg weakness, and endorses pain relief after getting Norco in the ED.  LSO brace was placed, and patient was discharged in stable condition.   Patient seen in conjunction with Dr. Liam Redhead, who agreed with the above plan of care.   Lorain Robson, MD 11/18/23 1734    Dorenda Gandy, MD 11/29/23 [phone]

## 2023-11-19 ENCOUNTER — Other Ambulatory Visit: Payer: Self-pay | Admitting: Sports Medicine

## 2023-11-19 DIAGNOSIS — S42294A Other nondisplaced fracture of upper end of right humerus, initial encounter for closed fracture: Secondary | ICD-10-CM

## 2023-11-19 DIAGNOSIS — M859 Disorder of bone density and structure, unspecified: Secondary | ICD-10-CM

## 2023-11-19 DIAGNOSIS — M810 Age-related osteoporosis without current pathological fracture: Secondary | ICD-10-CM

## 2023-11-26 ENCOUNTER — Inpatient Hospital Stay (HOSPITAL_BASED_OUTPATIENT_CLINIC_OR_DEPARTMENT_OTHER): Admission: RE | Admit: 2023-11-26 | Source: Ambulatory Visit | Admitting: Radiology

## 2023-11-29 ENCOUNTER — Other Ambulatory Visit: Payer: Self-pay | Admitting: Neurosurgery

## 2023-11-29 DIAGNOSIS — S32010A Wedge compression fracture of first lumbar vertebra, initial encounter for closed fracture: Secondary | ICD-10-CM

## 2023-11-30 ENCOUNTER — Ambulatory Visit
Admission: RE | Admit: 2023-11-30 | Discharge: 2023-11-30 | Disposition: A | Discharge status: Home / Self Care | Source: Ambulatory Visit | Attending: Neurosurgery | Admitting: Neurosurgery

## 2023-11-30 DIAGNOSIS — S32010A Wedge compression fracture of first lumbar vertebra, initial encounter for closed fracture: Secondary | ICD-10-CM

## 2023-12-02 ENCOUNTER — Telehealth (HOSPITAL_BASED_OUTPATIENT_CLINIC_OR_DEPARTMENT_OTHER): Payer: Self-pay | Admitting: Orthopaedic Surgery

## 2023-12-02 NOTE — Telephone Encounter (Signed)
 Patient wants to know if Dr B treats compression fracture of the back

## 2023-12-03 ENCOUNTER — Encounter (HOSPITAL_BASED_OUTPATIENT_CLINIC_OR_DEPARTMENT_OTHER): Payer: Self-pay

## 2023-12-15 ENCOUNTER — Ambulatory Visit (INDEPENDENT_AMBULATORY_CARE_PROVIDER_SITE_OTHER): Admitting: Physician Assistant

## 2023-12-15 ENCOUNTER — Encounter: Payer: Self-pay | Admitting: Physician Assistant

## 2023-12-15 VITALS — Ht 67.5 in | Wt 202.0 lb

## 2023-12-15 DIAGNOSIS — M810 Age-related osteoporosis without current pathological fracture: Secondary | ICD-10-CM | POA: Insufficient documentation

## 2023-12-15 LAB — COMPREHENSIVE METABOLIC PANEL WITH GFR
AG Ratio: 1.7 (calc) (ref 1.0–2.5)
ALT: 8 U/L (ref 6–29)
AST: 10 U/L (ref 10–35)
Albumin: 5 g/dL (ref 3.6–5.1)
Alkaline phosphatase (APISO): 105 U/L (ref 37–153)
BUN: 14 mg/dL (ref 7–25)
CO2: 21 mmol/L (ref 20–32)
Calcium: 10.3 mg/dL (ref 8.6–10.4)
Chloride: 105 mmol/L (ref 98–110)
Creat: 0.62 mg/dL (ref 0.50–1.03)
Globulin: 2.9 g/dL (ref 1.9–3.7)
Glucose, Bld: 172 mg/dL — ABNORMAL HIGH (ref 65–99)
Potassium: 3.9 mmol/L (ref 3.5–5.3)
Sodium: 139 mmol/L (ref 135–146)
Total Bilirubin: 0.3 mg/dL (ref 0.2–1.2)
Total Protein: 7.9 g/dL (ref 6.1–8.1)
eGFR: 106 mL/min/{1.73_m2} (ref >=60)

## 2023-12-15 LAB — EXTRA LAV TOP TUBE

## 2023-12-15 NOTE — Progress Notes (Signed)
 Office Visit Note   Patient: Veronica Cortez           Date of Birth: Nov 18, 1970           MRN: [id number] Visit Date: 12/15/2023              Requested by: Shauna Del, DO [address] [place]  [address] [place],  [place] [address] PCP: Vevelyn Gowers, NP   Assessment & Plan: Visit Diagnoses:  1. Age related osteoporosis, unspecified pathological fracture presence   2. Age-related osteoporosis without current pathological fracture     Plan: Patient is a pleasant 53 year old woman who has been referred for evaluation of osteoporosis.  She has had 4 fractures in the last few years.  She is status post femoral neck fracture in 2023 she compression fracture of L1 most recently and she is undergoing a kyphoplasty tomorrow.  Also history of a humerus fracture and a ankle fracture.  She did see Dr. Vaughn Georges last year who put her on Fosamax.  She did not get a bone density scan and discontinued the Fosamax.  She finally did have a bone density scan demonstrating a negative Z-score and a T-score of -1.9.  She has no cardiac history no cancer history no kidney history no history of ulcers or bypass surgery she does have bad reflux but does say she did follow-up tolerate the Fosamax quite well.  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  She underwent menopause in her early 44s from a hysterectomy.  She admits she does not take calcium  or vitamin D  every day.  She did have some hormone replacement therapy.  She is a former smoker.  Does not drink.  She admits she has not really been doing any exercises because of her current lumbar fracture.  Given her multiple fractures and risk factors including early menopause osteopenia score she is diagnosed as osteoporosis.  She has not had a vitamin D  or a calcium  done in a year so I will order those today.  She may have to start on Fosamax.  I do think given her young age that she would do well with Evenity or Prolia as  well.  Will make decisions once I see the results of her labs also talked to her extensively about trying to get back to exercising.  She is lactose intolerant so I did give her information about calcium  sources that would be nondairy.  Spent over 45 minutes reviewing her chart discussing and answering questions  Follow-Up Instructions: Return if symptoms worsen or fail to improve.   Orders:  Orders Placed This Encounter  Procedures   Vitamin D  (25 hydroxy)   Comprehensive Metabolic Panel (CMET)   No orders of the defined types were placed in this encounter.     Procedures: No procedures performed   Clinical Data: No additional findings.   Subjective: Chief Complaint  Patient presents with   Osteoporosis    HPI patient is a pleasant 53 year old woman is referred for osteoporosis clinic.  She has been diagnosed with multiple fractures including her most recent lumbar compression fracture for which she is getting a kyphoplasty tomorrow.  Also history of a femoral neck fracture as well as an ankle and proximal humerus fracture.  She did go through menopause with a hysterectomy when she was in her early 80s.  She was on hormone replacement but not currently.  She was on Fosamax for a while but discontinued it  because she could not get her bone scan  Review of Systems  All other systems reviewed and are negative.    Objective: Vital Signs: Ht 5' 7.5" (1.715 m)   Wt 202 lb (91.6 kg)   BMI 31.17 kg/m   Physical Exam Constitutional:      Appearance: Normal appearance.  Pulmonary:     Effort: Pulmonary effort is normal.  Skin:    General: Skin is warm and dry.  Neurological:     General: No focal deficit present.     Mental Status: She is alert and oriented to person, place, and time.     Ortho Exam  Specialty Comments:  No specialty comments available.  Imaging: No results found.   PMFS History: Patient Active Problem List   Diagnosis Date Noted   Age-related  osteoporosis without current pathological fracture 12/15/2023   Femoral fracture (HCC) 10/18/2021   Closed subcapital fracture of right femur (HCC)    Fall    Hyperglycemia due to diabetes mellitus (HCC)    Acute hemorrhagic colitis 04/16/2021   Insulin -requiring or dependent type II diabetes mellitus (HCC)    Asthma    Essential hypertension    Depression    Past Medical History:  Diagnosis Date   Asthma    Depression    Diabetes mellitus without complication (HCC)    Fibromyalgia    GERD (gastroesophageal reflux disease)    Hypertension    IBD (inflammatory bowel disease)    Migraine    Neuropathy    Sleep apnea     Family History  Problem Relation Age of Onset   Breast cancer Mother    Kidney failure Father    Breast cancer Maternal Grandmother    Breast cancer Maternal Great-grandmother     Past Surgical History:  Procedure Laterality Date   ABDOMINAL HYSTERECTOMY     BACK SURGERY  2013   ENDOSCOPIC PLANTAR FASCIOTOMY     HAND SURGERY     HIP PINNING,CANNULATED Right 10/18/2021   Procedure: CANNULATED HIP PINNING;  Surgeon: Wilhelmenia Harada, MD;  Location: [facility] OR;  Service: Orthopedics;  Laterality: Right;   TUBAL LIGATION     Social History   Occupational History   Not on file  Tobacco Use   Smoking status: Former    Current packs/day: 0.00    Types: Cigarettes    Quit date: 2012    Years since quitting: 13.3   Smokeless tobacco: Never  Vaping Use   Vaping status: Never Used  Substance and Sexual Activity   Alcohol use: Yes    Alcohol/week: 2.0 standard drinks of alcohol    Types: 2 Glasses of wine per week   Drug use: Never   Sexual activity: Not on file

## 2023-12-31 ENCOUNTER — Ambulatory Visit (HOSPITAL_BASED_OUTPATIENT_CLINIC_OR_DEPARTMENT_OTHER): Admitting: Radiology

## 2024-01-14 ENCOUNTER — Encounter (HOSPITAL_BASED_OUTPATIENT_CLINIC_OR_DEPARTMENT_OTHER): Payer: Self-pay | Admitting: Radiology

## 2024-01-14 ENCOUNTER — Ambulatory Visit (HOSPITAL_BASED_OUTPATIENT_CLINIC_OR_DEPARTMENT_OTHER)
Admission: RE | Admit: 2024-01-14 | Discharge: 2024-01-14 | Disposition: A | Discharge status: Home / Self Care | Source: Ambulatory Visit | Attending: Nurse Practitioner | Admitting: Nurse Practitioner

## 2024-01-14 DIAGNOSIS — Z1231 Encounter for screening mammogram for malignant neoplasm of breast: Secondary | ICD-10-CM | POA: Insufficient documentation

## 2024-04-21 ENCOUNTER — Other Ambulatory Visit: Payer: Self-pay | Admitting: Surgery

## 2024-04-21 DIAGNOSIS — M5416 Radiculopathy, lumbar region: Secondary | ICD-10-CM

## 2024-05-05 ENCOUNTER — Ambulatory Visit
Admission: RE | Admit: 2024-05-05 | Discharge: 2024-05-05 | Disposition: A | Discharge status: Home / Self Care | Source: Ambulatory Visit | Attending: Surgery | Admitting: Surgery

## 2024-05-05 DIAGNOSIS — M5416 Radiculopathy, lumbar region: Secondary | ICD-10-CM

## 2024-05-12 ENCOUNTER — Observation Stay (HOSPITAL_COMMUNITY)
Admission: EM | Admit: 2024-05-12 | Discharge: 2024-05-13 | Discharge status: Against Medical Advice | Attending: Internal Medicine | Admitting: Internal Medicine

## 2024-05-12 ENCOUNTER — Emergency Department (HOSPITAL_COMMUNITY)

## 2024-05-12 ENCOUNTER — Other Ambulatory Visit: Payer: Self-pay

## 2024-05-12 ENCOUNTER — Encounter (HOSPITAL_COMMUNITY): Payer: Self-pay | Admitting: Emergency Medicine

## 2024-05-12 DIAGNOSIS — J3489 Other specified disorders of nose and nasal sinuses: Secondary | ICD-10-CM | POA: Diagnosis not present

## 2024-05-12 DIAGNOSIS — R4182 Altered mental status, unspecified: Secondary | ICD-10-CM | POA: Diagnosis present

## 2024-05-12 DIAGNOSIS — Z79899 Other long term (current) drug therapy: Secondary | ICD-10-CM | POA: Insufficient documentation

## 2024-05-12 DIAGNOSIS — M545 Low back pain, unspecified: Secondary | ICD-10-CM | POA: Diagnosis present

## 2024-05-12 DIAGNOSIS — E872 Acidosis, unspecified: Secondary | ICD-10-CM | POA: Insufficient documentation

## 2024-05-12 DIAGNOSIS — Z8669 Personal history of other diseases of the nervous system and sense organs: Secondary | ICD-10-CM | POA: Insufficient documentation

## 2024-05-12 DIAGNOSIS — K449 Diaphragmatic hernia without obstruction or gangrene: Secondary | ICD-10-CM | POA: Insufficient documentation

## 2024-05-12 DIAGNOSIS — G8929 Other chronic pain: Secondary | ICD-10-CM | POA: Diagnosis present

## 2024-05-12 DIAGNOSIS — Z8679 Personal history of other diseases of the circulatory system: Secondary | ICD-10-CM

## 2024-05-12 DIAGNOSIS — M5441 Lumbago with sciatica, right side: Secondary | ICD-10-CM | POA: Insufficient documentation

## 2024-05-12 DIAGNOSIS — E860 Dehydration: Secondary | ICD-10-CM | POA: Diagnosis not present

## 2024-05-12 DIAGNOSIS — F10920 Alcohol use, unspecified with intoxication, uncomplicated: Secondary | ICD-10-CM | POA: Insufficient documentation

## 2024-05-12 DIAGNOSIS — Z794 Long term (current) use of insulin: Secondary | ICD-10-CM | POA: Diagnosis not present

## 2024-05-12 DIAGNOSIS — J4540 Moderate persistent asthma, uncomplicated: Secondary | ICD-10-CM | POA: Diagnosis present

## 2024-05-12 DIAGNOSIS — G9341 Metabolic encephalopathy: Secondary | ICD-10-CM | POA: Diagnosis not present

## 2024-05-12 DIAGNOSIS — Z8659 Personal history of other mental and behavioral disorders: Secondary | ICD-10-CM

## 2024-05-12 DIAGNOSIS — F32A Depression, unspecified: Secondary | ICD-10-CM | POA: Diagnosis not present

## 2024-05-12 DIAGNOSIS — R918 Other nonspecific abnormal finding of lung field: Secondary | ICD-10-CM | POA: Insufficient documentation

## 2024-05-12 DIAGNOSIS — D72829 Elevated white blood cell count, unspecified: Secondary | ICD-10-CM | POA: Diagnosis present

## 2024-05-12 DIAGNOSIS — Z5329 Procedure and treatment not carried out because of patient's decision for other reasons: Secondary | ICD-10-CM | POA: Insufficient documentation

## 2024-05-12 DIAGNOSIS — E119 Type 2 diabetes mellitus without complications: Secondary | ICD-10-CM | POA: Insufficient documentation

## 2024-05-12 DIAGNOSIS — Z87891 Personal history of nicotine dependence: Secondary | ICD-10-CM | POA: Insufficient documentation

## 2024-05-12 DIAGNOSIS — I10 Essential (primary) hypertension: Secondary | ICD-10-CM | POA: Diagnosis not present

## 2024-05-12 DIAGNOSIS — G9340 Encephalopathy, unspecified: Principal | ICD-10-CM | POA: Diagnosis present

## 2024-05-12 DIAGNOSIS — E1140 Type 2 diabetes mellitus with diabetic neuropathy, unspecified: Secondary | ICD-10-CM | POA: Diagnosis not present

## 2024-05-12 DIAGNOSIS — R4781 Slurred speech: Secondary | ICD-10-CM | POA: Diagnosis present

## 2024-05-12 HISTORY — DX: Low back pain, unspecified: M54.50

## 2024-05-12 LAB — BASIC METABOLIC PANEL WITH GFR
Anion gap: 9 (ref 5–15)
BUN: 11 mg/dL (ref 6–20)
CO2: 19 mmol/L — ABNORMAL LOW (ref 22–32)
Calcium: 8.9 mg/dL (ref 8.9–10.3)
Chloride: 112 mmol/L — ABNORMAL HIGH (ref 98–111)
Creatinine, Ser: 0.76 mg/dL (ref 0.44–1.00)
GFR, Estimated: >60 mL/min (ref >60)
Glucose, Bld: 154 mg/dL — ABNORMAL HIGH (ref 70–99)
Potassium: 3.7 mmol/L (ref 3.5–5.1)
Sodium: 140 mmol/L (ref 135–145)

## 2024-05-12 LAB — CBC WITH DIFFERENTIAL/PLATELET
Abs Immature Granulocytes: 0.05 K/uL (ref 0.00–0.07)
Basophils Absolute: 0.2 K/uL — ABNORMAL HIGH (ref 0.0–0.1)
Basophils Relative: 1 %
Eosinophils Absolute: 0.5 K/uL (ref 0.0–0.5)
Eosinophils Relative: 4 %
HCT: 40.9 % (ref 36.0–46.0)
Hemoglobin: 13.3 g/dL (ref 12.0–15.0)
Immature Granulocytes: 0 %
Lymphocytes Relative: 34 %
Lymphs Abs: 4.6 K/uL — ABNORMAL HIGH (ref 0.7–4.0)
MCH: 28.7 pg (ref 26.0–34.0)
MCHC: 32.5 g/dL (ref 30.0–36.0)
MCV: 88.1 fL (ref 80.0–100.0)
Monocytes Absolute: 1.1 K/uL — ABNORMAL HIGH (ref 0.1–1.0)
Monocytes Relative: 8 %
Neutro Abs: 7.1 K/uL (ref 1.7–7.7)
Neutrophils Relative %: 53 %
Platelets: 273 K/uL (ref 150–400)
RBC: 4.64 MIL/uL (ref 3.87–5.11)
RDW: 12.5 % (ref 11.5–15.5)
WBC: 13.5 K/uL — ABNORMAL HIGH (ref 4.0–10.5)
nRBC: 0 % (ref 0.0–0.2)

## 2024-05-12 MED ORDER — OXYCODONE-ACETAMINOPHEN 5-325 MG PO TABS
2.0000 | ORAL_TABLET | Freq: Once | ORAL | Status: AC
  Administered 2024-05-12: 2 via ORAL
  Filled 2024-05-12: qty 2

## 2024-05-12 MED ORDER — CYCLOBENZAPRINE HCL 10 MG PO TABS
5.0000 mg | ORAL_TABLET | Freq: Once | ORAL | Status: AC
  Administered 2024-05-12: 5 mg via ORAL
  Filled 2024-05-12: qty 1

## 2024-05-12 NOTE — ED Provider Triage Note (Signed)
 Emergency Medicine Provider Triage Evaluation Note  Veronica Cortez , a 53 y.o. female  was evaluated in triage.  Pt complains of back pain. Hx of discectomy and had sudden onset of back pain. No trauma or injury. No incontinence. Given fentanyl  by EMS  Review of Systems  Positive: Back pain  Negative: Incontinence   Physical Exam  BP 103/61 (BP Location: Right Arm)   Pulse 85   Temp 98.2 F (36.8 C) (Oral)   Resp 16   SpO2 93%  Gen:   Awake, no distress   Resp:  Normal effort  MSK:   Lower lumbar tenderness  Other:    Medical Decision Making  Medically screening exam initiated at 11:01 PM.  Appropriate orders placed.  Veronica Cortez was informed that the remainder of the evaluation will be completed by another provider, this initial triage assessment does not replace that evaluation, and the importance of remaining in the ED until their evaluation is complete.  Veronica Cortez is a 53 y.o. female here with back pain. Hx of back pain with previous discectomy. No obvious saddle anesthesia. Will get basic labs and CT lumbar spine and pain meds     Patt Alm Macho, MD 05/12/24 2302

## 2024-05-12 NOTE — ED Triage Notes (Signed)
 150 mcg Fentanyl  given by EMS pta to ED.

## 2024-05-12 NOTE — ED Triage Notes (Signed)
 Pt arrive by [facility] from home for chronic back pain getting increase today. Pt taking Hydrocodone  with no relief. Pt states pain is radiating to her legs. Pt has a long hx of herniated disc, back fx and sciatica pain. Pt denies any urinary symptoms today.

## 2024-05-13 ENCOUNTER — Encounter (HOSPITAL_COMMUNITY): Payer: Self-pay | Admitting: Internal Medicine

## 2024-05-13 ENCOUNTER — Emergency Department (HOSPITAL_COMMUNITY)

## 2024-05-13 ENCOUNTER — Observation Stay (HOSPITAL_COMMUNITY)

## 2024-05-13 DIAGNOSIS — E860 Dehydration: Secondary | ICD-10-CM | POA: Diagnosis not present

## 2024-05-13 DIAGNOSIS — E119 Type 2 diabetes mellitus without complications: Secondary | ICD-10-CM | POA: Diagnosis not present

## 2024-05-13 DIAGNOSIS — G9341 Metabolic encephalopathy: Secondary | ICD-10-CM | POA: Diagnosis not present

## 2024-05-13 DIAGNOSIS — R4182 Altered mental status, unspecified: Secondary | ICD-10-CM | POA: Diagnosis present

## 2024-05-13 DIAGNOSIS — M545 Low back pain, unspecified: Secondary | ICD-10-CM | POA: Diagnosis not present

## 2024-05-13 DIAGNOSIS — J4540 Moderate persistent asthma, uncomplicated: Secondary | ICD-10-CM | POA: Diagnosis present

## 2024-05-13 DIAGNOSIS — Z794 Long term (current) use of insulin: Secondary | ICD-10-CM

## 2024-05-13 DIAGNOSIS — G9340 Encephalopathy, unspecified: Principal | ICD-10-CM | POA: Diagnosis present

## 2024-05-13 DIAGNOSIS — G8929 Other chronic pain: Secondary | ICD-10-CM | POA: Diagnosis present

## 2024-05-13 DIAGNOSIS — D72829 Elevated white blood cell count, unspecified: Secondary | ICD-10-CM | POA: Diagnosis present

## 2024-05-13 DIAGNOSIS — E872 Acidosis, unspecified: Secondary | ICD-10-CM | POA: Diagnosis present

## 2024-05-13 DIAGNOSIS — Z8659 Personal history of other mental and behavioral disorders: Secondary | ICD-10-CM

## 2024-05-13 DIAGNOSIS — Z8669 Personal history of other diseases of the nervous system and sense organs: Secondary | ICD-10-CM

## 2024-05-13 DIAGNOSIS — Z8679 Personal history of other diseases of the circulatory system: Secondary | ICD-10-CM

## 2024-05-13 LAB — PROTIME-INR
INR: 1 (ref 0.8–1.2)
Prothrombin Time: 13.4 s (ref 11.4–15.2)

## 2024-05-13 LAB — BLOOD GAS, VENOUS
Acid-base deficit: 5 mmol/L — ABNORMAL HIGH (ref 0.0–2.0)
Bicarbonate: 21.6 mmol/L (ref 20.0–28.0)
O2 Saturation: 54.3 %
Patient temperature: 37
pCO2, Ven: 45 mmHg (ref 44–60)
pH, Ven: 7.29 (ref 7.25–7.43)
pO2, Ven: 32 mmHg (ref 32–45)

## 2024-05-13 LAB — CBC WITH DIFFERENTIAL/PLATELET
Abs Immature Granulocytes: 0.06 K/uL (ref 0.00–0.07)
Basophils Absolute: 0.1 K/uL (ref 0.0–0.1)
Basophils Relative: 1 %
Eosinophils Absolute: 0 K/uL (ref 0.0–0.5)
Eosinophils Relative: 0 %
HCT: 40.4 % (ref 36.0–46.0)
Hemoglobin: 12.9 g/dL (ref 12.0–15.0)
Immature Granulocytes: 1 %
Lymphocytes Relative: 8 %
Lymphs Abs: 0.8 K/uL (ref 0.7–4.0)
MCH: 28.7 pg (ref 26.0–34.0)
MCHC: 31.9 g/dL (ref 30.0–36.0)
MCV: 89.8 fL (ref 80.0–100.0)
Monocytes Absolute: 0.1 K/uL (ref 0.1–1.0)
Monocytes Relative: 1 %
Neutro Abs: 7.9 K/uL — ABNORMAL HIGH (ref 1.7–7.7)
Neutrophils Relative %: 89 %
Platelets: 238 K/uL (ref 150–400)
RBC: 4.5 MIL/uL (ref 3.87–5.11)
RDW: 12.4 % (ref 11.5–15.5)
WBC: 8.9 K/uL (ref 4.0–10.5)
nRBC: 0 % (ref 0.0–0.2)

## 2024-05-13 LAB — URINALYSIS, ROUTINE W REFLEX MICROSCOPIC
Bacteria, UA: NONE SEEN
Bilirubin Urine: NEGATIVE
Glucose, UA: NEGATIVE mg/dL
Hgb urine dipstick: NEGATIVE
Ketones, ur: NEGATIVE mg/dL
Leukocytes,Ua: NEGATIVE
Nitrite: NEGATIVE
Protein, ur: NEGATIVE mg/dL
Specific Gravity, Urine: >1.046 — ABNORMAL HIGH (ref 1.005–1.030)
pH: 6 (ref 5.0–8.0)

## 2024-05-13 LAB — COMPREHENSIVE METABOLIC PANEL WITH GFR
ALT: 18 U/L (ref 0–44)
AST: 15 U/L (ref 15–41)
Albumin: 3.6 g/dL (ref 3.5–5.0)
Alkaline Phosphatase: 68 U/L (ref 38–126)
Anion gap: 11 (ref 5–15)
BUN: 12 mg/dL (ref 6–20)
CO2: 19 mmol/L — ABNORMAL LOW (ref 22–32)
Calcium: 8.8 mg/dL — ABNORMAL LOW (ref 8.9–10.3)
Chloride: 107 mmol/L (ref 98–111)
Creatinine, Ser: 0.79 mg/dL (ref 0.44–1.00)
GFR, Estimated: >60 mL/min (ref >60)
Glucose, Bld: 321 mg/dL — ABNORMAL HIGH (ref 70–99)
Potassium: 3.8 mmol/L (ref 3.5–5.1)
Sodium: 137 mmol/L (ref 135–145)
Total Bilirubin: 0.6 mg/dL (ref 0.0–1.2)
Total Protein: 6.4 g/dL — ABNORMAL LOW (ref 6.5–8.1)

## 2024-05-13 LAB — TROPONIN I (HIGH SENSITIVITY)
Troponin I (High Sensitivity): <2 ng/L (ref <18)
Troponin I (High Sensitivity): 3 ng/L (ref <18)

## 2024-05-13 LAB — MAGNESIUM: Magnesium: 2 mg/dL (ref 1.7–2.4)

## 2024-05-13 LAB — HEMOGLOBIN A1C
Hgb A1c MFr Bld: 8.3 % — ABNORMAL HIGH (ref 4.8–5.6)
Mean Plasma Glucose: 191.51 mg/dL

## 2024-05-13 LAB — CBG MONITORING, ED
Glucose-Capillary: 233 mg/dL — ABNORMAL HIGH (ref 70–99)
Glucose-Capillary: 276 mg/dL — ABNORMAL HIGH (ref 70–99)

## 2024-05-13 LAB — I-STAT VENOUS BLOOD GAS, ED
Acid-base deficit: 8 mmol/L — ABNORMAL HIGH (ref 0.0–2.0)
Bicarbonate: 21.1 mmol/L (ref 20.0–28.0)
Calcium, Ion: 1.27 mmol/L (ref 1.15–1.40)
HCT: 45 % (ref 36.0–46.0)
Hemoglobin: 15.3 g/dL — ABNORMAL HIGH (ref 12.0–15.0)
O2 Saturation: 52 %
Potassium: 4.1 mmol/L (ref 3.5–5.1)
Sodium: 142 mmol/L (ref 135–145)
TCO2: 23 mmol/L (ref 22–32)
pCO2, Ven: 55.2 mmHg (ref 44–60)
pH, Ven: 7.192 — CL (ref 7.25–7.43)
pO2, Ven: 34 mmHg (ref 32–45)

## 2024-05-13 LAB — C-REACTIVE PROTEIN: CRP: 0.7 mg/dL (ref <1.0)

## 2024-05-13 LAB — I-STAT CG4 LACTIC ACID, ED
Lactic Acid, Venous: 0.9 mmol/L (ref 0.5–1.9)
Lactic Acid, Venous: 0.8 mmol/L (ref 0.5–1.9)

## 2024-05-13 LAB — TSH: TSH: 3.361 u[IU]/mL (ref 0.350–4.500)

## 2024-05-13 LAB — AMMONIA: Ammonia: 37 umol/L — ABNORMAL HIGH (ref 9–35)

## 2024-05-13 LAB — SEDIMENTATION RATE: Sed Rate: 6 mm/h (ref 0–22)

## 2024-05-13 LAB — VITAMIN B12: Vitamin B-12: 386 pg/mL (ref 180–914)

## 2024-05-13 LAB — GLUCOSE, CAPILLARY: Glucose-Capillary: 237 mg/dL — ABNORMAL HIGH (ref 70–99)

## 2024-05-13 LAB — CK: Total CK: 79 U/L (ref 38–234)

## 2024-05-13 MED ORDER — ACETAMINOPHEN 650 MG RE SUPP: 650.0000 mg | Freq: Four times a day (QID) | RECTAL | Status: DC | PRN

## 2024-05-13 MED ORDER — FLUTICASONE FUROATE-VILANTEROL 200-25 MCG/ACT IN AEPB
1.0000 | INHALATION_SPRAY | Freq: Every day | RESPIRATORY_TRACT | Status: DC
  Administered 2024-05-13: 1 via RESPIRATORY_TRACT
  Filled 2024-05-13: qty 28

## 2024-05-13 MED ORDER — UBROGEPANT 100 MG PO TABS: 100.0000 mg | ORAL_TABLET | Freq: Two times a day (BID) | ORAL | Status: DC | PRN

## 2024-05-13 MED ORDER — NALOXONE HCL 2 MG/2ML IJ SOSY: 2.0000 mg | PREFILLED_SYRINGE | Freq: Once | INTRAMUSCULAR | Status: AC

## 2024-05-13 MED ORDER — INSULIN ASPART 100 UNIT/ML IJ SOLN
0.0000 [IU] | Freq: Four times a day (QID) | INTRAMUSCULAR | Status: DC
  Administered 2024-05-13: 2 [IU] via SUBCUTANEOUS

## 2024-05-13 MED ORDER — INSULIN ASPART 100 UNIT/ML IJ SOLN
0.0000 [IU] | Freq: Three times a day (TID) | INTRAMUSCULAR | Status: DC
  Administered 2024-05-13: 5 [IU] via SUBCUTANEOUS
  Administered 2024-05-13: 8 [IU] via SUBCUTANEOUS

## 2024-05-13 MED ORDER — IOHEXOL 350 MG/ML SOLN
90.0000 mL | Freq: Once | INTRAVENOUS | Status: AC | PRN
Start: 2024-05-13 — End: 2024-05-13
  Administered 2024-05-13: 90 mL via INTRAVENOUS

## 2024-05-13 MED ORDER — INSULIN GLARGINE 100 UNIT/ML ~~LOC~~ SOLN
10.0000 [IU] | Freq: Every day | SUBCUTANEOUS | Status: DC
  Filled 2024-05-13 (×4): qty 0.1

## 2024-05-13 MED ORDER — ACETAMINOPHEN 325 MG PO TABS
650.0000 mg | ORAL_TABLET | Freq: Four times a day (QID) | ORAL | Status: DC | PRN
  Administered 2024-05-13: 650 mg via ORAL
  Filled 2024-05-13: qty 2

## 2024-05-13 MED ORDER — INSULIN ASPART 100 UNIT/ML IJ SOLN: 0.0000 [IU] | Freq: Every day | INTRAMUSCULAR | Status: DC

## 2024-05-13 MED ORDER — NALOXONE HCL 2 MG/2ML IJ SOSY
PREFILLED_SYRINGE | INTRAMUSCULAR | Status: AC
  Administered 2024-05-13: 2 mg via INTRAVENOUS
  Filled 2024-05-13: qty 2

## 2024-05-13 MED ORDER — LACTATED RINGERS IV SOLN: INTRAVENOUS | Status: AC

## 2024-05-13 MED ORDER — ONDANSETRON HCL 4 MG/2ML IJ SOLN
INTRAMUSCULAR | Status: AC
  Filled 2024-05-13: qty 2

## 2024-05-13 MED ORDER — MONTELUKAST SODIUM 10 MG PO TABS
10.0000 mg | ORAL_TABLET | Freq: Every day | ORAL | Status: DC
  Administered 2024-05-13: 10 mg via ORAL
  Filled 2024-05-13: qty 1

## 2024-05-13 MED ORDER — LACTATED RINGERS IV BOLUS
1000.0000 mL | Freq: Once | INTRAVENOUS | Status: AC
  Administered 2024-05-13: 1000 mL via INTRAVENOUS

## 2024-05-13 MED ORDER — ALBUTEROL SULFATE (2.5 MG/3ML) 0.083% IN NEBU: 2.5000 mg | INHALATION_SOLUTION | RESPIRATORY_TRACT | Status: DC | PRN

## 2024-05-13 MED ORDER — INSULIN GLARGINE 100 UNIT/ML ~~LOC~~ SOLN
10.0000 [IU] | Freq: Every day | SUBCUTANEOUS | Status: DC
  Administered 2024-05-13: 10 [IU] via SUBCUTANEOUS
  Filled 2024-05-13: qty 0.1

## 2024-05-13 MED ORDER — OXYCODONE HCL 5 MG PO TABS
5.0000 mg | ORAL_TABLET | Freq: Four times a day (QID) | ORAL | Status: DC | PRN
  Administered 2024-05-13: 5 mg via ORAL
  Filled 2024-05-13: qty 1

## 2024-05-13 MED ORDER — DEXAMETHASONE SODIUM PHOSPHATE 10 MG/ML IJ SOLN
10.0000 mg | Freq: Once | INTRAMUSCULAR | Status: AC
  Administered 2024-05-13: 10 mg via INTRAVENOUS
  Filled 2024-05-13: qty 1

## 2024-05-13 MED ORDER — ONDANSETRON HCL 4 MG/2ML IJ SOLN: 4.0000 mg | Freq: Four times a day (QID) | INTRAMUSCULAR | Status: DC | PRN

## 2024-05-13 NOTE — ED Notes (Signed)
Pt provided with coffee 

## 2024-05-13 NOTE — ED Provider Notes (Signed)
 [facility]ERGENCY [facility]PARTMENT AT [facility] Provider Note   CSN: [id number] Arrival date & time: 05/12/24  2241     Patient presents with: No chief complaint on file.   Veronica Cortez is a 53 y.o. female.   Level 5 caveat.  altered mental status and slurred speech.  Patient with history of hypertension, fibromyalgia, IBS,, GERD, chronic back pain here with worsening of her right-sided low back pain.  States she is having sciatica pain that radiates down her right leg.  Does have chronic pain for which she takes hydrocodone  and denies any fall or recent injury.  She did have a compression fracture treated with kyphoplasty in May and has ongoing right sided sciatica pain since discectomy in 2013.  Denies any recent fall or trauma.  She has chronic pain which is worse today which is why she called EMS.  She has slurred speech and hypotensive after receiving fentanyl  in triage.  Complains of pain to her right low back that radiates down her right leg.  No associated numbness or tingling.  No bowel or bladder incontinence.  No fever or vomiting.  No chest pain or shortness of breath.  No history of IV drug abuse or cancer.  Did have recent MRI in September.  The history is provided by the patient. The history is limited by the condition of the patient.       Prior to Admission medications   Medication Sig Start Date End Date Taking? Authorizing Provider  ADVAIR HFA 115-21 MCG/ACT inhaler Inhale 2 puffs into the lungs 2 (two) times daily. 03/20/21   [provider]  AJOVY 225 MG/1.5ML SOAJ Inject 1.5 mLs into the skin every 30 (thirty) days. 04/09/21   [provider]  albuterol  (VENTOLIN  HFA) 108 (90 Base) MCG/ACT inhaler Inhale 2 puffs into the lungs every 4 (four) hours as needed for wheezing or shortness of breath. 03/17/21   [provider]  amitriptyline  (ELAVIL ) 100 MG tablet Take 100 mg by mouth at bedtime. 04/08/21   [provider]   aspirin -acetaminophen -caffeine (EXCEDRIN MIGRAINE) 250-250-65 MG tablet Take 2 tablets by mouth every 6 (six) hours as needed for headache.    [provider]  atenolol  (TENORMIN ) 25 MG tablet Take 25 mg by mouth daily. 04/08/21   [provider]  dicyclomine  (BENTYL ) 10 MG capsule Take 1 capsule (10 mg total) by mouth 3 (three) times daily before meals. 04/18/21 10/18/21  Lue Elsie BROCKS, MD  enoxaparin  (LOVENOX ) 40 MG/0.4ML injection Inject 0.4 mLs (40 mg total) into the skin daily for 14 days. 10/20/21 11/03/21  Brimage, Vondra, DO  FLUoxetine  (PROZAC ) 40 MG capsule Take 40 mg by mouth daily. 04/08/21   [provider]  HYDROcodone -acetaminophen  (NORCO/VICODIN) 5-325 MG tablet Take 1-2 tablets by mouth every 6 (six) hours as needed for moderate pain (pain score 4-6). 11/18/23   Nivia Colon, PA-C  Insulin  Aspart FlexPen (NOVOLOG ) 100 UNIT/ML Inject 10-16 Units into the skin 3 (three) times daily. 04/11/21   [provider]  JANUVIA 100 MG tablet Take 100 mg by mouth daily. 04/08/21   [provider]  LANTUS  SOLOSTAR 100 UNIT/ML Solostar Pen Inject 30 Units into the skin at bedtime. 03/18/21   [provider]  Melatonin 10 MG TABS Take 10 mg by mouth at bedtime.    [provider]  methocarbamol  (ROBAXIN ) 500 MG tablet Take 1 tablet (500 mg total) by mouth 2 (two) times daily. 10/29/23   Mannie Fairy DASEN,  DO  montelukast  (SINGULAIR ) 10 MG tablet Take 10 mg by mouth daily. 03/17/21   [provider]  Omega-3 Fatty Acids (FISH OIL PO) Take 1 capsule by mouth daily.    [provider]  omeprazole (PRILOSEC) 20 MG capsule Take 20 mg by mouth 2 (two) times daily. 04/08/21   [provider]  ondansetron  (ZOFRAN -ODT) 8 MG disintegrating tablet Take 1 tablet (8 mg total) by mouth every 8 (eight) hours as needed for nausea or vomiting. 09/13/22   Emelia Sluder, PA-C  oxyCODONE -acetaminophen  (PERCOCET/ROXICET) 5-325 MG tablet Take 1  tablet by mouth every 6 (six) hours as needed for severe pain. 09/13/22   Emelia Sluder, PA-C  Probiotic Product (PROBIOTIC PO) Take 1 capsule by mouth daily.    [provider]  promethazine  (PHENERGAN ) 25 MG tablet Take 25 mg by mouth daily as needed for nausea/vomiting. 04/08/21   [provider]  Riboflavin (B2 PO) Take 1 tablet by mouth daily.    [provider]  rosuvastatin  (CRESTOR ) 10 MG tablet Take 10 mg by mouth at bedtime. 04/08/21   [provider]  topiramate  (TOPAMAX ) 200 MG tablet Take 200 mg by mouth 2 (two) times daily. 03/18/21   [provider]  UBRELVY  100 MG TABS Take 100 mg by mouth 2 (two) times daily as needed for migraine. 04/09/21   [provider]  VITAMIN E PO Take 1 tablet by mouth daily.    [provider]    Allergies: Acetazolamide, Benadryl [diphenhydramine], Clarithromycin, Compazine [prochlorperazine], Fentanyl , Lamictal [lamotrigine], Other, Reglan [metoclopramide], and Topiramate     Review of Systems  Constitutional:  Negative for activity change, appetite change and fever.  HENT:  Negative for congestion.   Respiratory:  Negative for cough, chest tightness and shortness of breath.   Cardiovascular:  Negative for chest pain.  Gastrointestinal:  Negative for abdominal pain, nausea and vomiting.  Genitourinary:  Negative for dysuria and hematuria.  Musculoskeletal:  Positive for arthralgias, back pain and myalgias.  Skin:  Negative for rash.  Neurological:  Negative for dizziness, weakness and headaches.    all other systems are negative except as noted in the HPI and PMH.   Updated Vital Signs BP (!) 92/48   Pulse 87   Temp 98.2 F (36.8 C) (Oral)   Resp (!) 25   SpO2 97%   Physical Exam Vitals and nursing note reviewed.  Constitutional:      General: She is not in acute distress.    Appearance: She is well-developed.     Comments: Slow speech, slurred words  HENT:     Head:  Normocephalic and atraumatic.     Mouth/Throat:     Pharynx: No oropharyngeal exudate.  Eyes:     Conjunctiva/sclera: Conjunctivae normal.     Pupils: Pupils are equal, round, and reactive to light.  Neck:     Comments: No meningismus. Cardiovascular:     Rate and Rhythm: Normal rate and regular rhythm.     Heart sounds: Normal heart sounds. No murmur heard. Pulmonary:     Effort: Pulmonary effort is normal. No respiratory distress.     Breath sounds: Normal breath sounds.  Abdominal:     Palpations: Abdomen is soft.     Tenderness: There is no abdominal tenderness. There is no guarding or rebound.  Musculoskeletal:        General: Tenderness present. Normal range of motion.     Cervical back: Normal range of motion and neck supple.  Comments: R paraspinal lumbar tenderness.  5/5 strength in bilateral lower extremities. Ankle plantar and dorsiflexion intact. Great toe extension intact bilaterally. +2 DP and PT pulses.   Skin:    General: Skin is warm.  Neurological:     Mental Status: She is alert and oriented to person, place, and time.     Cranial Nerves: No cranial nerve deficit.     Motor: No abnormal muscle tone.     Coordination: Coordination normal.     Comments:  5/5 strength throughout. CN 2-12 intact.Equal grip strength.   Psychiatric:        Behavior: Behavior normal.     (all labs ordered are listed, but only abnormal results are displayed) Labs Reviewed  CBC WITH DIFFERENTIAL/PLATELET - Abnormal; Notable for the following components:      Result Value   WBC 13.5 (*)    Lymphs Abs 4.6 (*)    Monocytes Absolute 1.1 (*)    Basophils Absolute 0.2 (*)    All other components within normal limits  BASIC METABOLIC PANEL WITH GFR - Abnormal; Notable for the following components:   Chloride 112 (*)    CO2 19 (*)    Glucose, Bld 154 (*)    All other components within normal limits  TROPONIN I (HIGH SENSITIVITY)    EKG: None  Radiology: CT Lumbar Spine Wo  Contrast Result Date: 05/12/2024 EXAM: CT OF THE LUMBAR SPINE WITHOUT CONTRAST 05/12/2024 11:39:35 PM TECHNIQUE: CT of the lumbar spine was performed without the administration of intravenous contrast. Multiplanar reformatted images are provided for review. Automated exposure control, iterative reconstruction, and/or weight based adjustment of the mA/kV was utilized to reduce the radiation dose to as low as reasonably achievable. COMPARISON: 11/18/2023 CLINICAL HISTORY: Low back pain, increased fracture risk. Severe low back pain. FINDINGS: BONES AND ALIGNMENT: Status post L1 augmentation procedure. Approximately 50% central height loss. No acute fracture. Normal alignment. DEGENERATIVE CHANGES: L4-L5 mild facet hypertrophy without spinal canal or neural foraminal stenosis. L5-S1 disc space narrowing with endplate spurring and moderate bilateral foraminal stenosis no spinal canal stenosis. SOFT TISSUES: No acute abnormality. IMPRESSION: 1. L1 vertebral body status post augmentation with approximately 50% central height loss; no acute fracture identified. 2. L5-S1 disc space narrowing with endplate spurring and moderate bilateral neural foraminal stenosis, without spinal canal stenosis. 3. L4-5 mild facet hypertrophy without spinal canal or neural foraminal stenosis. Electronically signed by: Franky Stanford MD 05/12/2024 11:48 PM EDT RP Workstation: [id number]     .Critical Care  Performed by: Carita Senior, MD Authorized by: Carita Senior, MD   Critical care provider statement:    Critical care time (minutes):  45   Critical care time was exclusive of:  Separately billable procedures and treating other patients   Critical care was necessary to treat or prevent imminent or life-threatening deterioration of the following conditions:  Toxidrome   Critical care was time spent personally by me on the following activities:  Development of treatment plan with patient or surrogate, discussions with  consultants, evaluation of patient's response to treatment, examination of patient, ordering and review of laboratory studies, ordering and review of radiographic studies, ordering and performing treatments and interventions, pulse oximetry, re-evaluation of patient's condition, review of old charts, blood draw for specimens and obtaining history from patient or surrogate   I assumed direction of critical care for this patient from another provider in my specialty: no     Care discussed with: admitting provider      Medications  Ordered in the ED  dexamethasone  (DECADRON ) injection 10 mg (has no administration in time range)  oxyCODONE -acetaminophen  (PERCOCET/ROXICET) 5-325 MG per tablet 2 tablet (2 tablets Oral Given 05/12/24 2304)  cyclobenzaprine (FLEXERIL) tablet 5 mg (5 mg Oral Given 05/12/24 2306)                                    Medical Decision Making Amount and/or Complexity of Data Reviewed Labs: ordered. Decision-making details documented in ED Course. Radiology: ordered and independent interpretation performed. Decision-making details documented in ED Course. ECG/medicine tests: ordered and independent interpretation performed. Decision-making details documented in ED Course.  Risk Prescription drug management. Decision regarding hospitalization.  Acute on chronic back pain.  Denies fall or recent injury.  Does have slurred speech, hypotension and slow speech after receiving fentanyl  in triage.  No other neurological deficits.  Intact distal strength, sensation, pulses and reflexes.  Low concern for cord compression or cauda equina.  CT scan in triage shows stable L1 compression fracture.  Mild foraminal stenosis at L5/S1  MRI 05/05/24  IMPRESSION: Chronic L1 compression fracture without change in height loss. Vertebroplasty cement is identified. No acute fracture.   Otherwise, stable multilevel degenerative spondylosis with disc desiccation facet arthrosis. There is  caudal foraminal narrowing at L5-S1 secondary to disc osteophyte and facet arthrosis which is stable. Postsurgical changes are seen at the L5-S1 level.   No significant interval change or acute abnormality.  Patient seems to have slurred speech after receiving IV pain medication. No other neurological deficit identified.  Therefore code stroke not activated.  Will obtain imaging of brain however   Around 4 AM, patient  found to be significantly more confused than when she was first evaluated.  She did not receive any additional pain medication for when she first arrived.  Hypotensive in the 90s.  IV fluid resuscitation begun.  Will expand workup to involve blood and urine cultures as well as urinalysis and drug screen.  CT head was negative for stroke or other acute finding.  She is out of the window for TNK as her symptoms have been ongoing for several days.  Had difficulty waking her up requiring significant verbal and tactile stimulation to arouse her.  Blood sugar was not low.   MRI will be obtained to evaluate for stroke.  Will obtain VBG and ammonia to ensure no hypercarbia.  VBG shows acidosis of 7.19 of uncertain etiology.  No hypercarbia.  Lactate is normal.  Patient with waxing and waning mental status with difficulty staying awake at times.  Blood pressure has improved to the 100s.  Nursing staff did find a white powder in her purse but the patient admits that his cocaine.  Denies using it today however.  She did receive Narcan for decreased mental status and respirations with partial response.  Still having slurred speech. Maintaining airway at this time  Patient then found to have fentanyl  patch on her abdomen which she admits is her recently deceased mother's medication. States she was tired of hurting but has no thoughts of wanting to hurt herself. Patch removed. Patient advised this practice is dangerous and can cause life threatening respiratory depression. She is remorseful.    Will continue with plan for observation admission given her encephalopathy and recurrent somnolence but favor polypharmacy as etiology at this time. D/w Dr. Marcene. MRI brain pending.        Final diagnoses:  Acute  encephalopathy  Chronic right-sided low back pain with right-sided sciatica    ED Discharge Orders     None          Dajiah Kooi, Garnette, MD 05/13/24 1050

## 2024-05-13 NOTE — Plan of Care (Signed)

## 2024-05-13 NOTE — ED Notes (Signed)
 Called patient family per pt request.

## 2024-05-13 NOTE — ED Notes (Addendum)
 Pt less alert and fidgety. Oxygen saturation decreasing even with nasal cannula. Small bag with white substance found in patients purse. Patient stated substance was powder. MD Rancour advised and at bedside.

## 2024-05-13 NOTE — ED Notes (Signed)
 Pt bp reading soft at 92/48 ( 61), pt fidgety and in and out of sleep. Alert and able to answer questions. MD Rancour advised.

## 2024-05-13 NOTE — ED Notes (Signed)
 UDS added on to lab. Order printed and tubed up

## 2024-05-13 NOTE — ED Notes (Signed)
 Pt was able to ambulate to the restroom with assistance. Pt states she was given some cocaine by a friend for her birthday and she was doing that a day or so ago. She then used her mom's fentanyl  patch because her back was hurting.

## 2024-05-13 NOTE — ED Notes (Signed)
 Pt provided with coffee and crackers. Lunch tray ordered

## 2024-05-13 NOTE — H&P (Signed)
 History and Physical      Veronica Cortez FMW:[id number] DOB: 04-26-1971 DOA: 05/12/2024; DOS: 05/13/2024  PCP: Arloa Jarvis, NP  Patient coming from: home   I have personally briefly reviewed patient's old medical records in [facility]  Chief Complaint: low back pain  HPI: Veronica Cortez is a 53 y.o. female with medical history significant for chronic low back pain, status post kyphoplasty May 2025, type 2 diabetes mellitus, essential hypertension, moderate persistent asthma, depression, migraines, who is admitted to [facility] on 05/12/2024 with acute encephalopathy after presenting from home to [facility] ED complaining of low back pain.   In the context of the patient's altered mental status, the following history was provided by the patient to the ED PA earlier this evening, as well as my discussions with the EDP and via chart review.  In the context of a documented history of chronic low back pain for which the patient underwent kyphoplasty in May 2025, she presents to the ED this evening complaining of 1 week of worsening low back discomfort, without any report of sciatica and no report of any associated acute focal acute focal numbness, paresthesias, saddle anesthesia, or any report of new urinary retention or fecal incontinence.  The pain is prescribed prn Norco as an outpatient, but had noted no significant proven in her low back discomfort with increasing doses of prn Norco, ultimately leading to her presentation to the emergency department this evening.  It is noted that in the ED, her progress contained a powdery substance, which the patient was reported to have stated was cocaine.  Additional outpatient medications notable for amitriptyline , Prozac , Topamax  for migraine prophylaxis.  Patient was reported to have stated that she has been experiencing slurring of speech over the course of the last 3 days, but denied any acute focal weakness.  While in the ED,  patient became more somnolent following Flexeril and 10 mg of Percocet.   ED Course:  Vital signs in the ED were notable for the following: Afebrile; heart rates in the 70s to 90s; systolic blood pressures in the 90s to 1 teens; respiratory rate 14-25, oxygen saturation 96 to 99% on room air.  Labs were notable for the following: CMP was notable for the following: Sodium 140, chloride 112, bicarbonate 19, anion gap 9, creatinine 0.76 compared to 0.62 on 12/15/2023, glucose 233, ammonia 37 without any prior ammonia data point available for blood comparison.  High sensitive troponin I was initially less than 2, with repeat noted to be 3.  TSH 3.361.  Lactic acid initially 0.9 with repeat trending down to 0.8.  CBC notable for white blood cell count 13,500 with 53% neutrophils.  Urinalysis showed no white blood cells, leukocyte esterase/nitrate negative and was also notable for specific gravity greater than 1.046.  Blood cultures x 2 were collected in the ED.  Per my interpretation, EKG in ED demonstrated the following: Sinus rhythm with heart rate 93, normal intervals, no evidence of T wave or ST changes, including no evidence of ST elevation.  Imaging in the ED, per corresponding formal radiology read, was notable for the following: CT lumbar spine without contrast showed L1 vertebral body status post augmentation with approximately 50% central height loss, without any evidence of acute fracture.  Additionally, this imaging showed L5-S1 disc space narrowing with endplate spurring and moderate bilateral neural foraminal stenosis without any evidence of spinal canal stenosis.  CT a chest, abdomen, pelvis with dissection protocol showed no evidence of  acute intrathoracic, acute intra-abdominal, or acute intrapelvic process, including no evidence of pulmonary infiltrate just pneumonia.  CT head showed nones of acute intracranial process, including no evidence of acute ischemic infarct or any evidence of  intracranial hemorrhage.  Additionally, EDP is ordered MRI brain and MRI of the lumbar spine, with the studies currently pending.  While in the ED, the following were administered: Flexeril 5 mg p.o. x 1 dose, Percocet 5/325 mg p.o. x 2 tabs, Decadron  10 mg IV x 1 dose, lactated Ringer's x 1 L bolus.  Subsequently, the patient was admitted for further evaluation management of acute encephalopathy in the setting of presenting acute on chronic low back pain, not anion gap metabolic acidosis, mild leukocytosis, and clinical evidence to suggest dehydration.     Review of Systems: As per HPI otherwise 10 point review of systems negative.   Past Medical History:  Diagnosis Date   Asthma    Chronic low back pain    Depression    Diabetes mellitus without complication (HCC)    Fibromyalgia    GERD (gastroesophageal reflux disease)    Hypertension    IBD (inflammatory bowel disease)    Migraine    Neuropathy    Sleep apnea     Past Surgical History:  Procedure Laterality Date   ABDOMINAL HYSTERECTOMY     BACK SURGERY  2013   ENDOSCOPIC PLANTAR FASCIOTOMY     HAND SURGERY     HIP PINNING,CANNULATED Right 10/18/2021   Procedure: CANNULATED HIP PINNING;  Surgeon: Genelle Standing, MD;  Location: [facility] OR;  Service: Orthopedics;  Laterality: Right;   TUBAL LIGATION      Social History:  reports that she quit smoking about 13 years ago. Her smoking use included cigarettes. She has never used smokeless tobacco. She reports current alcohol use of about 2.0 standard drinks of alcohol per week. She reports that she does not use drugs.   Allergies  Allergen Reactions   Acetazolamide Other (See Comments)    unknown   Benadryl [Diphenhydramine] Other (See Comments)    unknown   Clarithromycin Other (See Comments)    unknown   Compazine [Prochlorperazine] Other (See Comments)    unknown   Fentanyl  Other (See Comments)    unknown   Lamictal [Lamotrigine] Other (See Comments)    unknown    Other Other (See Comments)    Triptans and statins; unknown   Reglan [Metoclopramide] Other (See Comments)    unknown   Topiramate  Other (See Comments)    unknown    Family History  Problem Relation Age of Onset   Breast cancer Mother    Kidney failure Father    Breast cancer Maternal Grandmother    Breast cancer Maternal Great-grandmother     Family history reviewed and not pertinent    Prior to Admission medications   Medication Sig Start Date End Date Taking? Authorizing Provider  ADVAIR HFA 115-21 MCG/ACT inhaler Inhale 2 puffs into the lungs 2 (two) times daily. 03/20/21   [provider]  AJOVY 225 MG/1.5ML SOAJ Inject 1.5 mLs into the skin every 30 (thirty) days. 04/09/21   [provider]  albuterol  (VENTOLIN  HFA) 108 (90 Base) MCG/ACT inhaler Inhale 2 puffs into the lungs every 4 (four) hours as needed for wheezing or shortness of breath. 03/17/21   [provider]  amitriptyline  (ELAVIL ) 100 MG tablet Take 100 mg by mouth at bedtime. 04/08/21   [provider]  aspirin -acetaminophen -caffeine (EXCEDRIN MIGRAINE) 250-250-65 MG tablet Take  2 tablets by mouth every 6 (six) hours as needed for headache.    [provider]  atenolol  (TENORMIN ) 25 MG tablet Take 25 mg by mouth daily. 04/08/21   [provider]  dicyclomine  (BENTYL ) 10 MG capsule Take 1 capsule (10 mg total) by mouth 3 (three) times daily before meals. 04/18/21 10/18/21  Lue Elsie BROCKS, MD  FLUoxetine  (PROZAC ) 40 MG capsule Take 40 mg by mouth daily. 04/08/21   [provider]  HYDROcodone -acetaminophen  (NORCO/VICODIN) 5-325 MG tablet Take 1-2 tablets by mouth every 6 (six) hours as needed for moderate pain (pain score 4-6). 11/18/23   Nivia Colon, PA-C  Insulin  Aspart FlexPen (NOVOLOG ) 100 UNIT/ML Inject 10-16 Units into the skin 3 (three) times daily. 04/11/21   [provider]  JANUVIA 100 MG tablet Take 100 mg by mouth daily. 04/08/21   [provider]  LANTUS  SOLOSTAR 100 UNIT/ML Solostar Pen Inject 30 Units into the skin at bedtime. 03/18/21   [provider]  Melatonin 10 MG TABS Take 10 mg by mouth at bedtime.    [provider]  montelukast  (SINGULAIR ) 10 MG tablet Take 10 mg by mouth daily. 03/17/21   [provider]  omeprazole (PRILOSEC) 20 MG capsule Take 20 mg by mouth 2 (two) times daily. 04/08/21   [provider]  Probiotic Product (PROBIOTIC PO) Take 1 capsule by mouth daily.    [provider]  promethazine  (PHENERGAN ) 25 MG tablet Take 25 mg by mouth daily as needed for nausea/vomiting. 04/08/21   [provider]  rosuvastatin  (CRESTOR ) 10 MG tablet Take 10 mg by mouth at bedtime. 04/08/21   [provider]  topiramate  (TOPAMAX ) 200 MG tablet Take 200 mg by mouth 2 (two) times daily. 03/18/21   [provider]  UBRELVY  100 MG TABS Take 100 mg by mouth 2 (two) times daily as needed for migraine. 04/09/21   [provider]     Objective    Physical Exam: Vitals:   05/13/24 0243 05/13/24 0245 05/13/24 0345 05/13/24 0400  BP:  (!) 91/52 105/60 103/63  Pulse:  85 87 92  Resp:  11 11 10   Temp: 98.4 F (36.9 C)     TempSrc: Oral     SpO2:  94% 98% 99%    General: appears to be stated age; somnolent Skin: warm, dry, no rash Head:  AT/[place] Mouth:  Oral mucosa membranes appear dry, normal dentition Neck: supple; trachea midline Heart:  RRR; did not appreciate any M/R/G Lungs: CTAB, did not appreciate any wheezes, rales, or rhonchi Abdomen: + BS; soft, ND,  Vascular: 2+ pedal pulses b/l; 2+ radial pulses b/l Extremities: no peripheral edema, no muscle wasting     Labs on Admission: I have personally reviewed following labs and imaging studies  CBC: Recent Labs  Lab 05/12/24 2312 05/13/24 0430  WBC 13.5*  --   NEUTROABS 7.1  --   HGB 13.3 15.3*  HCT 40.9 45.0  MCV 88.1  --   PLT 273  --    Basic Metabolic Panel: Recent  Labs  Lab 05/12/24 2312 05/13/24 0430  NA 140 142  K 3.7 4.1  CL 112*  --   CO2 19*  --   GLUCOSE 154*  --   BUN 11  --   CREATININE 0.76  --   CALCIUM  8.9  --    GFR: CrCl cannot be calculated (Unknown ideal weight.). Liver Function Tests: No results for input(s): AST, ALT, ALKPHOS,  BILITOT, PROT, ALBUMIN in the last 168 hours. No results for input(s): LIPASE, AMYLASE in the last 168 hours. Recent Labs  Lab 05/13/24 0422  AMMONIA 37*   Coagulation Profile: No results for input(s): INR, PROTIME in the last 168 hours. Cardiac Enzymes: No results for input(s): CKTOTAL, CKMB, CKMBINDEX, TROPONINI in the last 168 hours. BNP (last 3 results) No results for input(s): PROBNP in the last 8760 hours. HbA1C: No results for input(s): HGBA1C in the last 72 hours. CBG: Recent Labs  Lab 05/13/24 0423  GLUCAP 233*   Lipid Profile: No results for input(s): CHOL, HDL, LDLCALC, TRIG, CHOLHDL, LDLDIRECT in the last 72 hours. Thyroid Function Tests: Recent Labs    05/13/24 0422  TSH 3.361   Anemia Panel: No results for input(s): VITAMINB12, FOLATE, FERRITIN, TIBC, IRON, RETICCTPCT in the last 72 hours. Urine analysis:    Component Value Date/Time   COLORURINE YELLOW 05/13/2024 0430   APPEARANCEUR CLEAR 05/13/2024 0430   LABSPEC >1.046 (H) 05/13/2024 0430   PHURINE 6.0 05/13/2024 0430   GLUCOSEU NEGATIVE 05/13/2024 0430   HGBUR NEGATIVE 05/13/2024 0430   BILIRUBINUR NEGATIVE 05/13/2024 0430   KETONESUR NEGATIVE 05/13/2024 0430   PROTEINUR NEGATIVE 05/13/2024 0430   NITRITE NEGATIVE 05/13/2024 0430   LEUKOCYTESUR NEGATIVE 05/13/2024 0430    Radiological Exams on Admission: CT Angio Chest/Abd/Pel for Dissection W and/or Wo Contrast Result Date: 05/13/2024 EXAM: CTA CHEST, ABDOMEN AND PELVIS WITH AND WITHOUT CONTRAST 05/13/2024 02:30:56 AM TECHNIQUE: CTA of the chest was performed with and without the administration of  intravenous contrast. CTA of the abdomen and pelvis was performed with and without the administration of intravenous contrast. The contrast used was 90mL of iohexol  (OMNIPAQUE ) 350 MG/ML injection. Multiplanar reformatted images are provided for review. MIP images are provided for review. Automated exposure control, iterative reconstruction, and/or weight-based adjustment of the mA/kV was utilized to reduce the radiation dose to as low as reasonably achievable. COMPARISON: Comparison is made to CT abdomen and pelvis of April 16, 2021. No significant coronary artery calcification is seen on the current study. CLINICAL HISTORY: Acute aortic syndrome (AAS) suspected. FINDINGS: VASCULATURE: AORTA: No aortic intramural hematoma, dissection, or aneurysm. No significant atherosclerotic calcification within the thoracic aorta. PULMONARY ARTERIES: Central pulmonary arteries are of normal caliber. No pulmonary embolism is seen. GREAT VESSELS OF AORTIC ARCH: No acute finding. No dissection. No arterial occlusion or significant stenosis. CELIAC TRUNK: No acute finding. No occlusion or significant stenosis. SUPERIOR MESENTERIC ARTERY: No acute finding. No occlusion or significant stenosis. INFERIOR MESENTERIC ARTERY: No acute finding. No occlusion or significant stenosis. RENAL ARTERIES: No acute finding. No occlusion or significant stenosis. ILIAC ARTERIES: No acute finding. No occlusion or significant stenosis. CHEST: MEDIASTINUM: No mediastinal lymphadenopathy. The heart and pericardium demonstrate no acute abnormality. No pericardial effusion. Global cardiac size is within normal limits. A blood subclavian chest port is seen with its tip within the superior vena cava. LUNGS AND PLEURA: The lungs are without acute process. No focal consolidation or pulmonary edema. No evidence of pleural effusion or pneumothorax. THORACIC BONES AND SOFT TISSUES: No acute bone or soft tissue abnormality. Multiple thyroid nodules are seen  measuring up to 3 cm within the right thyroid lobe. These are not well assessed on this examination and dedicated thyroid sonography is recommended for further evaluation. No pathologic thoracic adenopathy. Esophagus is unremarkable. ABDOMEN AND PELVIS: LIVER: The liver is unremarkable. GALLBLADDER AND BILE DUCTS: Gallbladder is unremarkable. No biliary ductal dilatation. SPLEEN: The spleen is unremarkable. PANCREAS: The  pancreas is unremarkable. ADRENAL GLANDS: 19 mm left adrenal adenoma is seen, for which no follow-up imaging is recommended. Right adrenal gland is unremarkable. KIDNEYS, URETERS AND BLADDER: No stones in the kidneys or ureters. No hydronephrosis. No perinephric or periureteral stranding. Urinary bladder is unremarkable. GI AND BOWEL: Appendix is normal. The stomach, small bowel, and large bowel are otherwise unremarkable. REPRODUCTIVE: Uterus is unremarkable. No adnexal masses. PERITONEUM AND RETROPERITONEUM: No ascites or free air. A small fat-containing left inguinal hernia is seen. LYMPH NODES: No lymphadenopathy. ABDOMINAL BONES AND SOFT TISSUES: No acute abnormality of the bones. Right hip pinning has been performed. Status post L1 vertebroplasty. Osseous structures are age appropriate. No acute bone abnormality. No lytic or blastic bone lesion. IMPRESSION: 1. No evidence of acute aortic syndrome or pulmonary embolism. 2. Multiple thyroid nodules, largest measuring 3 cm in the right lobe, not well assessed on this examination. Recommend dedicated thyroid sonography for further evaluation. 3. 19 mm left adrenal adenoma, for which no follow-up imaging is recommended. 4. Small fat-containing left inguinal hernia. Electronically signed by: Dorethia Molt MD 05/13/2024 03:20 AM EDT RP Workstation: [id number]   CT Head Wo Contrast Result Date: 05/13/2024 EXAM: CT HEAD WITHOUT CONTRAST 05/13/2024 02:30:56 AM TECHNIQUE: CT of the head was performed without the administration of intravenous contrast.  Automated exposure control, iterative reconstruction, and/or weight based adjustment of the mA/kV was utilized to reduce the radiation dose to as low as reasonably achievable. COMPARISON: None available. CLINICAL HISTORY: Neuro deficit, acute, stroke suspected; slurred speech. FINDINGS: BRAIN AND VENTRICLES: No acute hemorrhage, evidence of acute infarct, hydrocephalus, extra-axial collection, mass effect, or midline shift. ORBITS: No acute abnormality. SINUSES: No acute abnormality. SOFT TISSUES AND SKULL: No acute soft tissue abnormality or skull fracture. IMPRESSION: 1. No acute intracranial abnormality. Electronically signed by: Franky Stanford MD 05/13/2024 03:06 AM EDT RP Workstation: [id number]   CT Lumbar Spine Wo Contrast Result Date: 05/12/2024 EXAM: CT OF THE LUMBAR SPINE WITHOUT CONTRAST 05/12/2024 11:39:35 PM TECHNIQUE: CT of the lumbar spine was performed without the administration of intravenous contrast. Multiplanar reformatted images are provided for review. Automated exposure control, iterative reconstruction, and/or weight based adjustment of the mA/kV was utilized to reduce the radiation dose to as low as reasonably achievable. COMPARISON: 11/18/2023 CLINICAL HISTORY: Low back pain, increased fracture risk. Severe low back pain. FINDINGS: BONES AND ALIGNMENT: Status post L1 augmentation procedure. Approximately 50% central height loss. No acute fracture. Normal alignment. DEGENERATIVE CHANGES: L4-L5 mild facet hypertrophy without spinal canal or neural foraminal stenosis. L5-S1 disc space narrowing with endplate spurring and moderate bilateral foraminal stenosis no spinal canal stenosis. SOFT TISSUES: No acute abnormality. IMPRESSION: 1. L1 vertebral body status post augmentation with approximately 50% central height loss; no acute fracture identified. 2. L5-S1 disc space narrowing with endplate spurring and moderate bilateral neural foraminal stenosis, without spinal canal stenosis. 3. L4-5 mild  facet hypertrophy without spinal canal or neural foraminal stenosis. Electronically signed by: Franky Stanford MD 05/12/2024 11:48 PM EDT RP Workstation: [id number]      Assessment/Plan   Principal Problem:   Acute encephalopathy Active Problems:   DM2 (diabetes mellitus, type 2) (HCC)   Acute on chronic low back pain   Metabolic acidosis with normal anion gap and bicarbonate losses   Leukocytosis   Dehydration   History of essential hypertension   Moderate persistent asthma   History of depression   Hx of migraines      #) Acute encephalopathy: Noted to be confused, somnolent in the  ED, particularly after receiving doses of Flexeril as well as Percocet 5/325 mg p.o. x 2 tabs, suggestive of a toxic component, with concern for potential additional pharmacologic contributions notably from outpatient polypharmacy, including several outpatient central acting medications which include Prozac , amitriptyline , high-dose Topamax , prn Norco, which she has been taking and increasing frequency over the last week due to worsening low back discomfort, but also in the setting of finding of a powdery substance in her purse, which she was reported to acknowledges representative of cocaine.  Will check urinary drug screen.  It is noted that the patient did perk up some with the dose of prn Narcan.  She does appear to be mildly dehydrated, as further detailed below, though without significant overt metabolic contributing factors at this time, including no evidence of underlying infectious process, including CT chest which showed no evidence of infiltrate to suggest pneumonia, will urinalysis was inconsistent with UTI.  Blood cultures x 2 were collected, and in the setting of her acute on chronic low back pain and recent kyphoplasty, will check ESR, CRP and follow-up result MRI lumbar spine.  Of note, ammonia level was 37, but without any prior values available for comparison.  TSH was within normal limits.   Given increase in somnolence over the course of her ED course, will repeat VBG, as initially she appeared to show combined metabolic and respiratory acidosis.  Additionally, EDP ordered MRI brain in the setting of patient's report of 3 days of slurred speech, placing her outside of window for code stroke or consideration for tPA or thrombectomy.  Additionally, the patient was found to be wearing a fentanyl  patch, for which she has no known prescription.  Plan: Follow-up result of MRI brain, MRI lumbar spine.  Every 4 hours neurochecks.  Continuous lactated Ringer's, as below.  N.p.o. for now check ESR, CRP, urinary drug screen, INR, repeat VBG, check CPK level, B12.  Repeat CBC, CMP, check serum magnesium level.  Remove fentanyl  patch at this time.                #) Acute on chronic low back pain: This was patient's initial presenting complaint, without any red flag symptoms reported, while CT lumbar spine showed no evidence of acute fracture, no evidence to suggest central canal stenosis or myelopathy.  At her recent increase in outpatient opioid use to better manage her worsening low back pain may have contributed to her presenting acute encephalopathy with suspected toxic contributions.  Plan: ESR, CRP, follow-up for x 2.  Follow-up result of MRI of the lumbar spine, as above.                   #) Non anion gap metabolic acidosis: As evidenced on presenting BMP, with potential contribution from Topamax , with associated common side effect of induction of metabolic acidosis with differential also including potential renal tubular acidosis given mild concomitant hyperchloremia.   Plan: Hold home Topamax  for now.  Repeat CMP in the morning.  Monitor strict I's and O's and daily weights.  Follow-up result repeat VBG, as above.                  #) The leukocytosis: Mild elevation with a cell count of 13,500 with 53% neutrophils.  In the absence of any  evidence of overt underlying infectious process, SIRS criteria not met for sepsis at this time.  However, will also follow for results of blood cultures x 2 and also add on ESR, CRP  given patient's report of acute on chronic low back pain kyphoplasty 5 months ago. In the absence of evidence of underlying infectious process in this hemodynamically stable patient, we will refrain from empiric initiation of IV antibiotics at this time.  There may also be a element of hemoconcentration of clinical evidence of dehydration.  Plan: Repeat CBC in the morning.  Monitor strict I's and O's and daily weights.  Follow-up result of MRI lumbar spine, ESR, CRP, and blood cultures x 2.  Lactated Ringer's, as below.                        #) Dehydration: Clinical suspicion for such, including the appearance of dry oral mucous membranes as well as laboratory findings notable for  UA demonstrating elevated specific gravity. Appears to be in the setting of   diminished oral intake as a consequence of increased somnolence.  She received a 1 L LR bolus in the ED.   Plan: Monitor strict I's and O's.  Daily weights.  CMP in the morning. IVF's in form of lactated Ringer's at 125 cc/h x 8 hours.SABRA                      #) Type 2 Diabetes Mellitus: documented history of such. Home insulin  regimen: Lantus  30 units SQ nightly, NovoLog  3 times daily sliding scale insulin , in addition to use of Januvia as an outpatient.  Most recent hemoglobin A1c was found to be 9.4% in March 2023.  Presenting blood sugar 233, without any evidence of anion gap metabolic acidosis to suggest DKA.  Will initiate slightly less than half of her home basal insulin  initially in order to reduce risk for insulin  development of hypoglycemia, particular given plan for n.p.o. status for now in the setting of her current somnolence.  Plan: accuchecks every 6 hours with low-dose sliding scale insulin .  Lantus  10 and subcu  nightly.  Hold home Januvia for now.  Add on hemoglobin A1c level.                        #) Essential Hypertension: documented h/o such, with outpatient antihypertensive regimen including atenolol .  SBP's in the ED today: 90s to low 100s mmHg. borderline soft blood pressures, will hold home atenolol  for now.  Plan: Close monitoring of subsequent BP via routine VS. holding beta-blocker for now, as above.  Monitor on telemetry.  Monitor strict I's and O's and daily weights.                      #) Moderate persistent asthma: documented history thereof, without clinical e/o to suggest current exacerbation. Outpatient respiratory regimen includes Advair, singular, prn albuterol  inhaler..   Plan: Check serum magnesium level. Continue home Advair, Singulair .  As needed albuterol  nebulizer.                     #) Depression: documented h/o such. On amitriptyline , Prozac  as outpatient.  Of note, TSH was found to be within normal limits when checked this evening.  Plan: In the setting of the patient's presenting altered mental status, with concern for polypharmacy and toxic applications of several centrally acting medications at home, including apparent recreational drug abuse, with finding of fentanyl  patch as well as powdery substance that the patient conveys represents cocaine, will hold home amitriptyline  and Prozac  for now.                     #)  History of migraines: On prophylactic Topamax  as an outpatient, which could be contributory to the patient's presenting metabolic acidosis.  Additionally, in the context of her presenting somnolence, will hold home Topamax  for now.  Plan: Hold home Topamax  for now, as above.  Repeat CMP in the morning.  Repeat VBG, as above.         DVT prophylaxis: SCD's   Code Status: Full code Family Communication: none Disposition Plan: Per Rounding Team Consults called: none;   Admission status: obs     I SPENT GREATER THAN 75  MINUTES IN CLINICAL CARE TIME/MEDICAL DECISION-MAKING IN COMPLETING THIS ADMISSION.      Eva NOVAK Marcelina Mclaurin DO [facility]  From 7PM - 7AM   05/13/2024, 6:48 AM

## 2024-05-13 NOTE — ED Notes (Signed)
 Pt pulled fentanyl  patch off of abdomen and stated it was not her medication but was using it. Removed patch and flushed. MD Rancour advised.

## 2024-05-13 NOTE — ED Notes (Signed)
 Patient transported to MRI

## 2024-05-13 NOTE — ED Notes (Signed)
 This phlebotomist stuck patient twice to collect labs and cultures. Unable to obtain cultures or find suitable vein for another attempt. RN aware

## 2024-05-13 NOTE — Progress Notes (Addendum)
 Brief same day note:  Patient is a 53 year old female with history of chronic low back pain, status post kyphoplasty in May 2025, type 2 diabetes, hypertension, moderate persistent asthma, depression, migraine who presented with acute encephalopathy, low back pain.  On presentation, she was confused.  Takes Norco, amitriptyline  ,Prozac , Topamax  at home.  She reported to have taken cocaine because of a friend gave her and she also used her mom's  fentanyl  patch for her back pain .no focal weakness but had slurred speech.  She remained hemodynamically stable in the emergency department.  Lab work showed elevated glucose level.  Normal troponin, TSH.  UA was not suspicious for UTI.  Blood cultures obtained.  CT lumbar spine showed L1 vertebral body status post augmentation.  With approximately 50% central height loss, no evidence of acute fracture L5-L1 disc space narrowing, without evidence of spinal canal stenosis.  CT chest/abdomen/pelvis with dissection protocol did not show any evidence of acute intrathoracic, acute intra-abdominal or intrapelvic process.  CT head did not show any acute findings.  MRI lumbar spine, MRI of the brain pending. Currently being managed for acute encephalopathy, low back pain.  Patient seen and examined at bedside today.  Hemodynamically stable.  Not in apparent distress .  She was fully oriented but easily distracted, not focused,seen to have   involuntary movements of her feet.  Complains of back pain.   Assessment and plan  Acute encephalopathy:Presented with confusion.  Confusion worsened after she received Flexeril, Percocet.  Also takes Prozac , amitriptyline , Topamax , Norco at home.  She was also wearing fentanyl  patch, prescription not known.  Polypharmacy suspected.  Patient also uses cocaine. Monitor mentation.  CT head did not show any acute findings.  MRI of the brain pending.  TSH normal, ammonia level near normal range  Acute on chronic low back pain: CT lumbar  spine did not show any evidence of acute findings.  MRI pending.  Leukocytosis: Mild.  UA not suspicious for UTI.  Follow-up blood cultures.  Antibiotics on hold  Dehydration: Continue gentle IV fluids  Type 2 diabetes: Uses insulin  at home.  A1c is 8.3.  Monitor blood sugars  Hypertension: Blood pressure soft.  Home medications on hold  Moderate persistent asthma: Currently not in exacerbation  Depression: On Amitriptyline , Prozac  at home  History of migraine: Takes Topamax  at home  Will consult PT

## 2024-05-13 NOTE — Progress Notes (Addendum)
 Pt used call bell to state that she would be leaving AMA. Primary RN went into patient's room with [facility] paperwork. Pt stated that recently filled prescription of Norco was taken from her in the ED. Primary RN called Pharmacy to check vault for pt's medication. Pharmacy stated they did not have medication and that per patient's flowsheet, no home medications were brought into hospital. Primary RN informed patient that no medications were in the pharmacy vault and charting shows no medication was brought to the hospital. Pt requested to speak to Charge RN. Pt's IV was removed without complication and dressing placed.

## 2024-05-13 NOTE — ED Notes (Signed)
 Searched patients body from head to to, no additional patches noted.

## 2024-05-13 NOTE — ED Notes (Signed)
 Pt states she is getting a migraine because she has not eaten. Wants to speak to a Dr. MD Jillian raker.

## 2024-05-17 ENCOUNTER — Other Ambulatory Visit: Payer: Medicare HMO

## 2024-05-18 ENCOUNTER — Encounter: Admitting: Physician Assistant

## 2024-05-18 LAB — CULTURE, BLOOD (ROUTINE X 2)
Culture: NO GROWTH
Culture: NO GROWTH

## 2024-06-12 ENCOUNTER — Encounter: Payer: Self-pay | Admitting: Radiology
